# Patient Record
Sex: Male | Born: 1950 | Race: White | Hispanic: No | Marital: Single | Smoking: Former smoker
Health system: Southern US, Community
[De-identification: ages and names within clinical notes are randomized; demographics above are authoritative.]

## PROBLEM LIST (undated history)

## (undated) DIAGNOSIS — M1990 Unspecified osteoarthritis, unspecified site: Secondary | ICD-10-CM

## (undated) DIAGNOSIS — I639 Cerebral infarction, unspecified: Secondary | ICD-10-CM

## (undated) DIAGNOSIS — E119 Type 2 diabetes mellitus without complications: Secondary | ICD-10-CM

## (undated) DIAGNOSIS — K219 Gastro-esophageal reflux disease without esophagitis: Secondary | ICD-10-CM

## (undated) DIAGNOSIS — Z87442 Personal history of urinary calculi: Secondary | ICD-10-CM

## (undated) DIAGNOSIS — I10 Essential (primary) hypertension: Secondary | ICD-10-CM

## (undated) HISTORY — DX: Type 2 diabetes mellitus without complications: E11.9

## (undated) HISTORY — PX: KNEE ARTHROSCOPY: SHX127

## (undated) HISTORY — PX: CHOLECYSTECTOMY: SHX55

---

## 1993-12-28 HISTORY — PX: BACK SURGERY: SHX140

## 2003-12-29 HISTORY — PX: APPENDECTOMY: SHX54

## 2006-09-10 ENCOUNTER — Ambulatory Visit (HOSPITAL_BASED_OUTPATIENT_CLINIC_OR_DEPARTMENT_OTHER): Admission: RE | Admit: 2006-09-10 | Discharge: 2006-09-10 | Payer: Self-pay | Admitting: Orthopedic Surgery

## 2014-12-18 ENCOUNTER — Encounter (HOSPITAL_COMMUNITY)
Admission: RE | Admit: 2014-12-18 | Discharge: 2014-12-18 | Disposition: A | Discharge status: Home / Self Care | Payer: Medicare Other | Source: Ambulatory Visit | Attending: Orthopedic Surgery | Admitting: Orthopedic Surgery

## 2014-12-18 ENCOUNTER — Encounter (HOSPITAL_COMMUNITY): Payer: Self-pay

## 2014-12-18 ENCOUNTER — Other Ambulatory Visit: Payer: Self-pay | Admitting: Orthopedic Surgery

## 2014-12-18 DIAGNOSIS — Z79899 Other long term (current) drug therapy: Secondary | ICD-10-CM | POA: Diagnosis not present

## 2014-12-18 DIAGNOSIS — I10 Essential (primary) hypertension: Secondary | ICD-10-CM | POA: Insufficient documentation

## 2014-12-18 DIAGNOSIS — Z9049 Acquired absence of other specified parts of digestive tract: Secondary | ICD-10-CM | POA: Insufficient documentation

## 2014-12-18 DIAGNOSIS — Z9089 Acquired absence of other organs: Secondary | ICD-10-CM | POA: Insufficient documentation

## 2014-12-18 DIAGNOSIS — Z981 Arthrodesis status: Secondary | ICD-10-CM | POA: Insufficient documentation

## 2014-12-18 DIAGNOSIS — Z01810 Encounter for preprocedural cardiovascular examination: Secondary | ICD-10-CM | POA: Insufficient documentation

## 2014-12-18 DIAGNOSIS — I371 Nonrheumatic pulmonary valve insufficiency: Secondary | ICD-10-CM | POA: Diagnosis not present

## 2014-12-18 DIAGNOSIS — N200 Calculus of kidney: Secondary | ICD-10-CM | POA: Diagnosis not present

## 2014-12-18 DIAGNOSIS — Z01818 Encounter for other preprocedural examination: Secondary | ICD-10-CM | POA: Insufficient documentation

## 2014-12-18 DIAGNOSIS — Z87891 Personal history of nicotine dependence: Secondary | ICD-10-CM | POA: Insufficient documentation

## 2014-12-18 DIAGNOSIS — M1712 Unilateral primary osteoarthritis, left knee: Secondary | ICD-10-CM | POA: Insufficient documentation

## 2014-12-18 DIAGNOSIS — M1990 Unspecified osteoarthritis, unspecified site: Secondary | ICD-10-CM | POA: Diagnosis not present

## 2014-12-18 DIAGNOSIS — E119 Type 2 diabetes mellitus without complications: Secondary | ICD-10-CM | POA: Diagnosis not present

## 2014-12-18 DIAGNOSIS — Z8673 Personal history of transient ischemic attack (TIA), and cerebral infarction without residual deficits: Secondary | ICD-10-CM | POA: Insufficient documentation

## 2014-12-18 DIAGNOSIS — K219 Gastro-esophageal reflux disease without esophagitis: Secondary | ICD-10-CM | POA: Diagnosis not present

## 2014-12-18 DIAGNOSIS — Z7982 Long term (current) use of aspirin: Secondary | ICD-10-CM | POA: Diagnosis not present

## 2014-12-18 HISTORY — DX: Cerebral infarction, unspecified: I63.9

## 2014-12-18 HISTORY — DX: Type 2 diabetes mellitus without complications: E11.9

## 2014-12-18 HISTORY — DX: Essential (primary) hypertension: I10

## 2014-12-18 HISTORY — DX: Personal history of urinary calculi: Z87.442

## 2014-12-18 HISTORY — DX: Gastro-esophageal reflux disease without esophagitis: K21.9

## 2014-12-18 HISTORY — DX: Unspecified osteoarthritis, unspecified site: M19.90

## 2014-12-18 LAB — TYPE AND SCREEN
ABO/RH(D): O NEG
ANTIBODY SCREEN: NEGATIVE

## 2014-12-18 LAB — BASIC METABOLIC PANEL
ANION GAP: 11 (ref 5–15)
BUN: 15 mg/dL (ref 6–23)
CALCIUM: 9.4 mg/dL (ref 8.4–10.5)
CO2: 26 mmol/L (ref 19–32)
CREATININE: 0.95 mg/dL (ref 0.50–1.35)
Chloride: 100 mEq/L (ref 96–112)
GFR, EST NON AFRICAN AMERICAN: 85 mL/min — AB (ref >90)
Glucose, Bld: 241 mg/dL — ABNORMAL HIGH (ref 70–99)
Potassium: 4 mmol/L (ref 3.5–5.1)
Sodium: 137 mmol/L (ref 135–145)

## 2014-12-18 LAB — SURGICAL PCR SCREEN
MRSA, PCR: NEGATIVE
STAPHYLOCOCCUS AUREUS: NEGATIVE

## 2014-12-18 LAB — CBC
HCT: 42.2 % (ref 39.0–52.0)
Hemoglobin: 14.6 g/dL (ref 13.0–17.0)
MCH: 31.8 pg (ref 26.0–34.0)
MCHC: 34.6 g/dL (ref 30.0–36.0)
MCV: 91.9 fL (ref 78.0–100.0)
Platelets: 257 10*3/uL (ref 150–400)
RBC: 4.59 MIL/uL (ref 4.22–5.81)
RDW: 12.8 % (ref 11.5–15.5)
WBC: 8 10*3/uL (ref 4.0–10.5)

## 2014-12-18 LAB — ABO/RH: ABO/RH(D): O NEG

## 2014-12-18 LAB — PROTIME-INR
INR: 0.96 (ref 0.00–1.49)
PROTHROMBIN TIME: 12.9 s (ref 11.6–15.2)

## 2014-12-18 LAB — APTT: aPTT: 30 seconds (ref 24–37)

## 2014-12-18 NOTE — Progress Notes (Signed)
Call to pharm. Tech to include additional medicine.

## 2014-12-18 NOTE — Pre-Procedure Instructions (Signed)
Cameron [name]  12/18/2014   Your procedure is scheduled on:  12/31/2014  [facility] Admitting  ENTRANCE A  at undetermined- AM., therefore on day of surgery at 8AM, please call Short Stay Dept. For arrival time  Call this number if you have problems the morning of surgery: [phone]   Remember:   Do not eat food or drink liquids after midnight. On SUNDAY   Take these medicines the morning of surgery with A SIP OF WATER: You can take pain medicine the morning of surgery if you need it.    Do not wear jewelry   Do not wear lotions, powders, or perfumes. You may wear deodorant.    Men may shave face and neck.   Do not bring valuables to the hospital.  [facility] is not responsible   for any belongings or valuables.               Contacts, dentures or bridgework may not be worn into surgery.   Leave suitcase in the car. After surgery it may be brought to your room.   For patients admitted to the hospital, discharge time is determined by your                treatment team.               Patients discharged the day of surgery will not be allowed to drive  home.  Name and phone number of your driver: with wife   Special Instructions: Special Instructions: [place] - Preparing for Surgery  Before surgery, you can play an important role.  Because skin is not sterile, your skin needs to be as free of germs as possible.  You can reduce the number of germs on you skin by washing with CHG (chlorahexidine gluconate) soap before surgery.  CHG is an antiseptic cleaner which kills germs and bonds with the skin to continue killing germs even after washing.  Please DO NOT use if you have an allergy to CHG or antibacterial soaps.  If your skin becomes reddened/irritated stop using the CHG and inform your nurse when you arrive at Short Stay.  Do not shave (including legs and underarms) for at least 48 hours prior to the first CHG shower.  You may shave your  face.  Please follow these instructions carefully:   1.  Shower with CHG Soap the night before surgery and the  morning of Surgery.  2.  If you choose to wash your hair, wash your hair first as usual with your  normal shampoo.  3.  After you shampoo, rinse your hair and body thoroughly to remove the  Shampoo.  4.  Use CHG as you would any other liquid soap.  You can apply chg directly to the skin and wash gently with scrungie or a clean washcloth.  5.  Apply the CHG Soap to your body ONLY FROM THE NECK DOWN.    Do not use on open wounds or open sores.  Avoid contact with your eyes, ears, mouth and genitals (private parts).  Wash genitals (private parts)   with your normal soap.  6.  Wash thoroughly, paying special attention to the area where your surgery will be performed.  7.  Thoroughly rinse your body with warm water from the neck down.  8.  DO NOT shower/wash with your normal soap after using and rinsing off   the CHG Soap.  9.  Pat yourself dry with  a clean towel.            10.  Wear clean pajamas.            11.  Place clean sheets on your bed the night of your first shower and do not sleep with pets.  Day of Surgery  Do not apply any lotions/deodorants the morning of surgery.  Please wear clean clothes to the hospital/surgery center.   Please read over the following fact sheets that you were given: Pain Booklet, Coughing and Deep Breathing, Blood Transfusion Information, MRSA Information and Surgical Site Infection Prevention, Video information sheet

## 2014-12-18 NOTE — Progress Notes (Signed)
Will fax request to [facility] For records, pt. Reports a small stroke /w echo being done 10/14.

## 2014-12-19 ENCOUNTER — Encounter (HOSPITAL_COMMUNITY): Payer: Self-pay

## 2014-12-19 NOTE — Progress Notes (Signed)
Anesthesia Chart Review: Patient is a 63 year old male scheduled for left TKA on 12/31/14 by Dr. Luiz BlareGraves.  History includes former smoker, HTN, DM2, right thalamic CVA with RLE weakness (denied residual symptoms) 10/2013 [facility]), GERD, nephrolithiasis, arthritis, cholecystectomy, appendectomy, lumbar fusion '95. PCP is Hillard DankerJudy Keighron, [place]NP-[place] in [place].  Meds include amlodipine, finasteride, glimepiride, metformin, omeprazole, Januvia, Mucinex, red yeast rice.  Echo 10/28/13 [facility]): Technically difficult study. Overall norma LV systolic function, no obvious LV regional wall abnormalities other than abnormal septal motion on limited views. Pulmonic regurgitation. Anterior echo-free space with differential including artifact, epicardial fat or pericardial effusion. Thickening of the atrial septum with sparing of the fossa ovale suggestive of benign lipomatous hypertrophy. Bubble contrast study was performed to assess for PFO but was technically diffcult, precluding accurate assessment. Recommendations: Consider TEE if clinically indicated.   According to 10/2013 discharge summary [facility], "Carotid dopplers and MRA was negative."  12/18/14 EKG: NSR.  Preoperative labs noted.  He will get a fasting glucose on arrival.  I called and spoke with patient.  He denies chest pain, SOB at rest, family or personal history of CAD.  He has mild chronic exertional dyspnea which he feels is stable.  He has walked with a cane for the past three years due to his knee arthritis, but is still able to play golf (uses cart) and do yard work like chopping wood without CV symptoms.  His fasting glucose runs ~ 120-140.    Above reviewed with anesthesiologist Dr. Maple HudsonMoser including echocardiogram findings from last year.  Patient reports reasonable exercise tolerance (ie able to chop wood). EKG is WNL. If no acute changes then anticipate that he can proceed as  planned. Definitive anesthesia plan following anesthesiologist evaluation on the day of surgery.  Velna Ochsllison Rokia Bosket, PA-C [facility] Short Stay Center/Anesthesiology Phone [phone] 12/19/2014 4:45 PM

## 2014-12-30 MED ORDER — CEFAZOLIN SODIUM-DEXTROSE 2-3 GM-% IV SOLR
2.0000 g | INTRAVENOUS | Status: AC
  Administered 2014-12-31: 2 g via INTRAVENOUS
  Filled 2014-12-30: qty 50

## 2014-12-31 ENCOUNTER — Encounter (HOSPITAL_COMMUNITY): Payer: Self-pay | Admitting: Certified Registered Nurse Anesthetist

## 2014-12-31 ENCOUNTER — Inpatient Hospital Stay (HOSPITAL_COMMUNITY): Payer: Medicare Other | Admitting: Vascular Surgery

## 2014-12-31 ENCOUNTER — Encounter (HOSPITAL_COMMUNITY)
Admission: RE | Disposition: A | Discharge status: Home / Self Care | Payer: Self-pay | Source: Ambulatory Visit | Attending: Orthopedic Surgery

## 2014-12-31 ENCOUNTER — Inpatient Hospital Stay (HOSPITAL_COMMUNITY): Payer: Medicare Other

## 2014-12-31 ENCOUNTER — Inpatient Hospital Stay (HOSPITAL_COMMUNITY)
Admission: RE | Admit: 2014-12-31 | Discharge: 2015-01-01 | DRG: 470 | Disposition: A | Discharge status: Home Health | Payer: Medicare Other | Source: Ambulatory Visit | Attending: Orthopedic Surgery | Admitting: Orthopedic Surgery

## 2014-12-31 ENCOUNTER — Inpatient Hospital Stay (HOSPITAL_COMMUNITY): Payer: Medicare Other | Admitting: Anesthesiology

## 2014-12-31 DIAGNOSIS — K219 Gastro-esophageal reflux disease without esophagitis: Secondary | ICD-10-CM | POA: Diagnosis present

## 2014-12-31 DIAGNOSIS — Z87891 Personal history of nicotine dependence: Secondary | ICD-10-CM

## 2014-12-31 DIAGNOSIS — Z8673 Personal history of transient ischemic attack (TIA), and cerebral infarction without residual deficits: Secondary | ICD-10-CM | POA: Diagnosis not present

## 2014-12-31 DIAGNOSIS — I10 Essential (primary) hypertension: Secondary | ICD-10-CM | POA: Diagnosis present

## 2014-12-31 DIAGNOSIS — Z01818 Encounter for other preprocedural examination: Secondary | ICD-10-CM

## 2014-12-31 DIAGNOSIS — M1712 Unilateral primary osteoarthritis, left knee: Secondary | ICD-10-CM | POA: Diagnosis present

## 2014-12-31 DIAGNOSIS — E119 Type 2 diabetes mellitus without complications: Secondary | ICD-10-CM | POA: Diagnosis present

## 2014-12-31 HISTORY — PX: TOTAL KNEE ARTHROPLASTY: SHX125

## 2014-12-31 LAB — GLUCOSE, CAPILLARY
GLUCOSE-CAPILLARY: 123 mg/dL — AB (ref 70–99)
GLUCOSE-CAPILLARY: 149 mg/dL — AB (ref 70–99)
Glucose-Capillary: 160 mg/dL — ABNORMAL HIGH (ref 70–99)

## 2014-12-31 LAB — CBC WITH DIFFERENTIAL/PLATELET
BASOS PCT: 1 % (ref 0–1)
Basophils Absolute: 0 10*3/uL (ref 0.0–0.1)
Eosinophils Absolute: 0.6 10*3/uL (ref 0.0–0.7)
Eosinophils Relative: 7 % — ABNORMAL HIGH (ref 0–5)
HCT: 41.2 % (ref 39.0–52.0)
HEMOGLOBIN: 14.2 g/dL (ref 13.0–17.0)
LYMPHS PCT: 31 % (ref 12–46)
Lymphs Abs: 2.6 10*3/uL (ref 0.7–4.0)
MCH: 30.8 pg (ref 26.0–34.0)
MCHC: 34.5 g/dL (ref 30.0–36.0)
MCV: 89.4 fL (ref 78.0–100.0)
MONO ABS: 0.5 10*3/uL (ref 0.1–1.0)
MONOS PCT: 6 % (ref 3–12)
Neutro Abs: 4.6 10*3/uL (ref 1.7–7.7)
Neutrophils Relative %: 55 % (ref 43–77)
Platelets: 252 10*3/uL (ref 150–400)
RBC: 4.61 MIL/uL (ref 4.22–5.81)
RDW: 12.8 % (ref 11.5–15.5)
WBC: 8.2 10*3/uL (ref 4.0–10.5)

## 2014-12-31 LAB — COMPREHENSIVE METABOLIC PANEL
ALBUMIN: 4.5 g/dL (ref 3.5–5.2)
ALK PHOS: 65 U/L (ref 39–117)
ALT: 26 U/L (ref 0–53)
AST: 34 U/L (ref 0–37)
Anion gap: 12 (ref 5–15)
BILIRUBIN TOTAL: 0.9 mg/dL (ref 0.3–1.2)
BUN: 13 mg/dL (ref 6–23)
CHLORIDE: 104 meq/L (ref 96–112)
CO2: 23 mmol/L (ref 19–32)
Calcium: 9 mg/dL (ref 8.4–10.5)
Creatinine, Ser: 0.85 mg/dL (ref 0.50–1.35)
GFR calc Af Amer: >90 mL/min (ref >90)
GFR calc non Af Amer: 89 mL/min — ABNORMAL LOW (ref >90)
Glucose, Bld: 166 mg/dL — ABNORMAL HIGH (ref 70–99)
POTASSIUM: 4 mmol/L (ref 3.5–5.1)
Sodium: 139 mmol/L (ref 135–145)
Total Protein: 7.6 g/dL (ref 6.0–8.3)

## 2014-12-31 LAB — URINALYSIS, ROUTINE W REFLEX MICROSCOPIC
Bilirubin Urine: NEGATIVE
Glucose, UA: NEGATIVE mg/dL
HGB URINE DIPSTICK: NEGATIVE
KETONES UR: 15 mg/dL — AB
Leukocytes, UA: NEGATIVE
Nitrite: NEGATIVE
PROTEIN: NEGATIVE mg/dL
Specific Gravity, Urine: 1.024 (ref 1.005–1.030)
Urobilinogen, UA: 1 mg/dL (ref 0.0–1.0)
pH: 6.5 (ref 5.0–8.0)

## 2014-12-31 SURGERY — ARTHROPLASTY, KNEE, TOTAL
Anesthesia: Monitor Anesthesia Care | Site: Knee | Laterality: Left

## 2014-12-31 MED ORDER — BUPIVACAINE LIPOSOME 1.3 % IJ SUSP
20.0000 mL | Freq: Once | INTRAMUSCULAR | Status: DC
  Filled 2014-12-31 (×2): qty 20

## 2014-12-31 MED ORDER — BUPIVACAINE IN DEXTROSE 0.75-8.25 % IT SOLN
INTRATHECAL | Status: DC | PRN
  Administered 2014-12-31: 2 mL via INTRATHECAL

## 2014-12-31 MED ORDER — ZOLPIDEM TARTRATE 5 MG PO TABS: 5.0000 mg | ORAL_TABLET | Freq: Every evening | ORAL | Status: DC | PRN

## 2014-12-31 MED ORDER — DIPHENHYDRAMINE HCL 12.5 MG/5ML PO ELIX
12.5000 mg | ORAL_SOLUTION | ORAL | Status: DC | PRN
  Administered 2015-01-01: 25 mg via ORAL
  Filled 2014-12-31: qty 10

## 2014-12-31 MED ORDER — ASPIRIN EC 325 MG PO TBEC
325.0000 mg | DELAYED_RELEASE_TABLET | Freq: Two times a day (BID) | ORAL | Status: DC
  Administered 2015-01-01: 325 mg via ORAL
  Filled 2014-12-31 (×3): qty 1

## 2014-12-31 MED ORDER — ROCURONIUM BROMIDE 50 MG/5ML IV SOLN
INTRAVENOUS | Status: AC
  Filled 2014-12-31: qty 1

## 2014-12-31 MED ORDER — KETOROLAC TROMETHAMINE 15 MG/ML IJ SOLN
INTRAMUSCULAR | Status: AC
  Filled 2014-12-31: qty 1

## 2014-12-31 MED ORDER — PROPOFOL INFUSION 10 MG/ML OPTIME
INTRAVENOUS | Status: DC | PRN
  Administered 2014-12-31: 50 ug/kg/min via INTRAVENOUS

## 2014-12-31 MED ORDER — DEXTROSE 5 % IV SOLN
500.0000 mg | Freq: Four times a day (QID) | INTRAVENOUS | Status: DC | PRN
  Filled 2014-12-31: qty 5

## 2014-12-31 MED ORDER — PROPOFOL 10 MG/ML IV BOLUS
INTRAVENOUS | Status: AC
  Filled 2014-12-31: qty 20

## 2014-12-31 MED ORDER — ALUM & MAG HYDROXIDE-SIMETH 200-200-20 MG/5ML PO SUSP: 30.0000 mL | ORAL | Status: DC | PRN

## 2014-12-31 MED ORDER — FINASTERIDE 5 MG PO TABS
5.0000 mg | ORAL_TABLET | Freq: Every day | ORAL | Status: DC
  Administered 2014-12-31 – 2015-01-01 (×2): 5 mg via ORAL
  Filled 2014-12-31 (×2): qty 1

## 2014-12-31 MED ORDER — LACTATED RINGERS IV SOLN: INTRAVENOUS | Status: DC

## 2014-12-31 MED ORDER — OXYCODONE HCL 5 MG PO TABS
ORAL_TABLET | ORAL | Status: AC
  Filled 2014-12-31: qty 1

## 2014-12-31 MED ORDER — OXYCODONE-ACETAMINOPHEN 5-325 MG PO TABS
1.0000 | ORAL_TABLET | ORAL | Status: DC | PRN
  Administered 2014-12-31 – 2015-01-01 (×3): 2 via ORAL
  Filled 2014-12-31 (×3): qty 2

## 2014-12-31 MED ORDER — BISACODYL 5 MG PO TBEC: 5.0000 mg | DELAYED_RELEASE_TABLET | Freq: Every day | ORAL | Status: DC | PRN

## 2014-12-31 MED ORDER — ONDANSETRON HCL 4 MG PO TABS: 4.0000 mg | ORAL_TABLET | Freq: Four times a day (QID) | ORAL | Status: DC | PRN

## 2014-12-31 MED ORDER — LACTATED RINGERS IV SOLN
INTRAVENOUS | Status: DC | PRN
  Administered 2014-12-31 (×2): via INTRAVENOUS

## 2014-12-31 MED ORDER — FENTANYL CITRATE 0.05 MG/ML IJ SOLN
INTRAMUSCULAR | Status: AC
  Filled 2014-12-31: qty 5

## 2014-12-31 MED ORDER — ACETAMINOPHEN 325 MG PO TABS: 650.0000 mg | ORAL_TABLET | Freq: Four times a day (QID) | ORAL | Status: DC | PRN

## 2014-12-31 MED ORDER — OXYCODONE HCL 5 MG/5ML PO SOLN: 5.0000 mg | Freq: Once | ORAL | Status: AC | PRN

## 2014-12-31 MED ORDER — KETOROLAC TROMETHAMINE 15 MG/ML IJ SOLN
7.5000 mg | Freq: Three times a day (TID) | INTRAMUSCULAR | Status: DC
  Administered 2014-12-31 – 2015-01-01 (×2): 7.5 mg via INTRAVENOUS
  Filled 2014-12-31 (×2): qty 1

## 2014-12-31 MED ORDER — MIDAZOLAM HCL 2 MG/2ML IJ SOLN
INTRAMUSCULAR | Status: AC
  Filled 2014-12-31: qty 2

## 2014-12-31 MED ORDER — CHLORHEXIDINE GLUCONATE 4 % EX LIQD
60.0000 mL | Freq: Once | CUTANEOUS | Status: DC
  Filled 2014-12-31: qty 60

## 2014-12-31 MED ORDER — PANTOPRAZOLE SODIUM 40 MG PO TBEC
40.0000 mg | DELAYED_RELEASE_TABLET | Freq: Every day | ORAL | Status: DC
  Administered 2014-12-31 – 2015-01-01 (×2): 40 mg via ORAL
  Filled 2014-12-31 (×2): qty 1

## 2014-12-31 MED ORDER — AMLODIPINE BESYLATE 2.5 MG PO TABS
2.5000 mg | ORAL_TABLET | Freq: Every day | ORAL | Status: DC
  Administered 2014-12-31: 2.5 mg via ORAL
  Filled 2014-12-31 (×2): qty 1

## 2014-12-31 MED ORDER — SODIUM CHLORIDE 0.9 % IR SOLN
Status: DC | PRN
  Administered 2014-12-31: 3000 mL

## 2014-12-31 MED ORDER — METHOCARBAMOL 500 MG PO TABS
500.0000 mg | ORAL_TABLET | Freq: Four times a day (QID) | ORAL | Status: DC | PRN
Start: 2014-12-31 — End: 2015-01-01
  Administered 2014-12-31 – 2015-01-01 (×2): 500 mg via ORAL
  Filled 2014-12-31: qty 1

## 2014-12-31 MED ORDER — ACETAMINOPHEN 650 MG RE SUPP: 650.0000 mg | Freq: Four times a day (QID) | RECTAL | Status: DC | PRN

## 2014-12-31 MED ORDER — INSULIN ASPART 100 UNIT/ML ~~LOC~~ SOLN: 0.0000 [IU] | Freq: Three times a day (TID) | SUBCUTANEOUS | Status: DC

## 2014-12-31 MED ORDER — BUPIVACAINE LIPOSOME 1.3 % IJ SUSP
INTRAMUSCULAR | Status: DC | PRN
  Administered 2014-12-31: 20 mL

## 2014-12-31 MED ORDER — OXYCODONE HCL 5 MG PO TABS
5.0000 mg | ORAL_TABLET | Freq: Once | ORAL | Status: AC | PRN
  Administered 2014-12-31: 5 mg via ORAL

## 2014-12-31 MED ORDER — PROMETHAZINE HCL 25 MG/ML IJ SOLN: 6.2500 mg | INTRAMUSCULAR | Status: DC | PRN

## 2014-12-31 MED ORDER — HYDROMORPHONE HCL 1 MG/ML IJ SOLN
INTRAMUSCULAR | Status: AC
  Filled 2014-12-31: qty 1

## 2014-12-31 MED ORDER — POLYETHYLENE GLYCOL 3350 17 G PO PACK: 17.0000 g | PACK | Freq: Every day | ORAL | Status: DC | PRN

## 2014-12-31 MED ORDER — FLEET ENEMA 7-19 GM/118ML RE ENEM: 1.0000 | ENEMA | Freq: Once | RECTAL | Status: AC | PRN

## 2014-12-31 MED ORDER — HYDROMORPHONE HCL 1 MG/ML IJ SOLN
0.2500 mg | INTRAMUSCULAR | Status: DC | PRN
  Administered 2014-12-31: 0.5 mg via INTRAVENOUS

## 2014-12-31 MED ORDER — METFORMIN HCL 500 MG PO TABS
1000.0000 mg | ORAL_TABLET | Freq: Two times a day (BID) | ORAL | Status: DC
  Administered 2014-12-31 – 2015-01-01 (×2): 1000 mg via ORAL
  Filled 2014-12-31 (×4): qty 2

## 2014-12-31 MED ORDER — FENTANYL CITRATE 0.05 MG/ML IJ SOLN
INTRAMUSCULAR | Status: DC | PRN
  Administered 2014-12-31: 50 ug via INTRAVENOUS
  Administered 2014-12-31 (×2): 25 ug via INTRAVENOUS

## 2014-12-31 MED ORDER — PROMETHAZINE HCL 25 MG/ML IJ SOLN: 6.2500 mg | Freq: Four times a day (QID) | INTRAMUSCULAR | Status: DC | PRN

## 2014-12-31 MED ORDER — SODIUM CHLORIDE 0.9 % IV SOLN
INTRAVENOUS | Status: DC
  Administered 2014-12-31: 22:00:00 via INTRAVENOUS

## 2014-12-31 MED ORDER — HYDROMORPHONE HCL 1 MG/ML IJ SOLN
1.0000 mg | INTRAMUSCULAR | Status: DC | PRN
  Administered 2014-12-31: 1 mg via INTRAVENOUS
  Administered 2015-01-01: 2 mg via INTRAVENOUS
  Filled 2014-12-31: qty 1
  Filled 2014-12-31: qty 2

## 2014-12-31 MED ORDER — OXYCODONE-ACETAMINOPHEN 5-325 MG PO TABS: 1.0000 | ORAL_TABLET | ORAL | Status: DC | PRN

## 2014-12-31 MED ORDER — DOCUSATE SODIUM 100 MG PO CAPS
100.0000 mg | ORAL_CAPSULE | Freq: Two times a day (BID) | ORAL | Status: DC
  Administered 2014-12-31 – 2015-01-01 (×2): 100 mg via ORAL
  Filled 2014-12-31 (×3): qty 1

## 2014-12-31 MED ORDER — GLIMEPIRIDE 4 MG PO TABS
4.0000 mg | ORAL_TABLET | Freq: Two times a day (BID) | ORAL | Status: DC
  Filled 2014-12-31 (×2): qty 1

## 2014-12-31 MED ORDER — TRANEXAMIC ACID 100 MG/ML IV SOLN
1000.0000 mg | INTRAVENOUS | Status: AC
  Administered 2014-12-31: 1000 mg via INTRAVENOUS
  Filled 2014-12-31 (×2): qty 10

## 2014-12-31 MED ORDER — METHOCARBAMOL 750 MG PO TABS: 750.0000 mg | ORAL_TABLET | Freq: Three times a day (TID) | ORAL | Status: DC | PRN

## 2014-12-31 MED ORDER — INFLUENZA VAC SPLIT QUAD 0.5 ML IM SUSY
0.5000 mL | PREFILLED_SYRINGE | INTRAMUSCULAR | Status: AC
  Administered 2015-01-01: 0.5 mL via INTRAMUSCULAR
  Filled 2014-12-31: qty 0.5

## 2014-12-31 MED ORDER — ONDANSETRON HCL 4 MG/2ML IJ SOLN: 4.0000 mg | Freq: Four times a day (QID) | INTRAMUSCULAR | Status: DC | PRN

## 2014-12-31 MED ORDER — LIDOCAINE HCL (CARDIAC) 20 MG/ML IV SOLN
INTRAVENOUS | Status: AC
  Filled 2014-12-31: qty 5

## 2014-12-31 MED ORDER — METHOCARBAMOL 500 MG PO TABS
ORAL_TABLET | ORAL | Status: AC
Start: 2014-12-31 — End: 2015-01-01
  Filled 2014-12-31: qty 1

## 2014-12-31 MED ORDER — BUPIVACAINE HCL (PF) 0.5 % IJ SOLN
INTRAMUSCULAR | Status: DC | PRN
  Administered 2014-12-31: 20 mL

## 2014-12-31 MED ORDER — ASPIRIN EC 325 MG PO TBEC: 325.0000 mg | DELAYED_RELEASE_TABLET | Freq: Two times a day (BID) | ORAL | Status: AC

## 2014-12-31 MED ORDER — LINAGLIPTIN 5 MG PO TABS
5.0000 mg | ORAL_TABLET | Freq: Every day | ORAL | Status: DC
  Administered 2014-12-31 – 2015-01-01 (×2): 5 mg via ORAL
  Filled 2014-12-31 (×2): qty 1

## 2014-12-31 MED ORDER — CEFAZOLIN SODIUM-DEXTROSE 2-3 GM-% IV SOLR
2.0000 g | Freq: Four times a day (QID) | INTRAVENOUS | Status: AC
  Administered 2014-12-31 – 2015-01-01 (×2): 2 g via INTRAVENOUS
  Filled 2014-12-31 (×2): qty 50

## 2014-12-31 MED ORDER — MIDAZOLAM HCL 5 MG/5ML IJ SOLN
INTRAMUSCULAR | Status: DC | PRN
  Administered 2014-12-31: 2 mg via INTRAVENOUS

## 2014-12-31 SURGICAL SUPPLY — 70 items
APL SKNCLS STERI-STRIP NONHPOA (GAUZE/BANDAGES/DRESSINGS) ×1
BANDAGE ESMARK 6X9 LF (GAUZE/BANDAGES/DRESSINGS) ×1 IMPLANT
BENZOIN TINCTURE PRP APPL 2/3 (GAUZE/BANDAGES/DRESSINGS) ×3 IMPLANT
BLADE SAGITTAL 25.0X1.19X90 (BLADE) ×2 IMPLANT
BLADE SAGITTAL 25.0X1.19X90MM (BLADE) ×1
BLADE SAW SAG 90X13X1.27 (BLADE) ×3 IMPLANT
BNDG CMPR 9X6 STRL LF SNTH (GAUZE/BANDAGES/DRESSINGS) ×1
BNDG ESMARK 6X9 LF (GAUZE/BANDAGES/DRESSINGS) ×3
BOWL SMART MIX CTS (DISPOSABLE) ×3 IMPLANT
CAP KNEE TOTAL 3 SIGMA ×2 IMPLANT
CATH ROBINSON RED A/P 14FR (CATHETERS) ×2 IMPLANT
CEMENT HV SMART SET (Cement) ×6 IMPLANT
CLOSURE WOUND 1/2 X4 (GAUZE/BANDAGES/DRESSINGS) ×1
COVER SURGICAL LIGHT HANDLE (MISCELLANEOUS) ×3 IMPLANT
CUFF TOURNIQUET SINGLE 34IN LL (TOURNIQUET CUFF) ×3 IMPLANT
CUFF TOURNIQUET SINGLE 44IN (TOURNIQUET CUFF) IMPLANT
DRAPE EXTREMITY T 121X128X90 (DRAPE) ×3 IMPLANT
DRAPE IMP U-DRAPE 54X76 (DRAPES) ×3 IMPLANT
DRAPE U-SHAPE 47X51 STRL (DRAPES) ×3 IMPLANT
DRSG PAD ABDOMINAL 8X10 ST (GAUZE/BANDAGES/DRESSINGS) ×3 IMPLANT
DURAPREP 26ML APPLICATOR (WOUND CARE) ×3 IMPLANT
ELECT REM PT RETURN 9FT ADLT (ELECTROSURGICAL) ×3
ELECTRODE REM PT RTRN 9FT ADLT (ELECTROSURGICAL) ×1 IMPLANT
EVACUATOR 1/8 PVC DRAIN (DRAIN) ×3 IMPLANT
FACESHIELD WRAPAROUND (MASK) IMPLANT
FACESHIELD WRAPAROUND OR TEAM (MASK) ×1 IMPLANT
GAUZE SPONGE 4X4 12PLY STRL (GAUZE/BANDAGES/DRESSINGS) ×3 IMPLANT
GAUZE XEROFORM 5X9 LF (GAUZE/BANDAGES/DRESSINGS) ×3 IMPLANT
GLOVE BIOGEL PI IND STRL 6.5 (GLOVE) IMPLANT
GLOVE BIOGEL PI IND STRL 8 (GLOVE) ×2 IMPLANT
GLOVE BIOGEL PI INDICATOR 6.5 (GLOVE) ×4
GLOVE BIOGEL PI INDICATOR 8 (GLOVE) ×4
GLOVE BIOGEL PI ORTHO PRO SZ7 (GLOVE) ×2
GLOVE ECLIPSE 7.5 STRL STRAW (GLOVE) ×6 IMPLANT
GLOVE PI ORTHO PRO STRL SZ7 (GLOVE) IMPLANT
GLOVE SURG ORTHO 8.5 STRL (GLOVE) ×2 IMPLANT
GLOVE SURG SS PI 6.5 STRL IVOR (GLOVE) ×2 IMPLANT
GLOVE SURG SS PI 7.0 STRL IVOR (GLOVE) ×4 IMPLANT
GOWN STRL REUS W/ TWL LRG LVL3 (GOWN DISPOSABLE) ×1 IMPLANT
GOWN STRL REUS W/ TWL XL LVL3 (GOWN DISPOSABLE) ×2 IMPLANT
GOWN STRL REUS W/TWL LRG LVL3 (GOWN DISPOSABLE) ×6
GOWN STRL REUS W/TWL XL LVL3 (GOWN DISPOSABLE) ×9
HANDPIECE INTERPULSE COAX TIP (DISPOSABLE) ×3
HOOD PEEL AWAY FACE SHEILD DIS (HOOD) ×7 IMPLANT
IMMOBILIZER KNEE 20 (SOFTGOODS) IMPLANT
IMMOBILIZER KNEE 22 UNIV (SOFTGOODS) ×3 IMPLANT
KIT BASIN OR (CUSTOM PROCEDURE TRAY) ×3 IMPLANT
KIT ROOM TURNOVER OR (KITS) ×3 IMPLANT
MANIFOLD NEPTUNE II (INSTRUMENTS) ×3 IMPLANT
NDL SPNL 22GX3.5 QUINCKE BK (NEEDLE) ×1 IMPLANT
NEEDLE SPNL 22GX3.5 QUINCKE BK (NEEDLE) ×3 IMPLANT
NS IRRIG 1000ML POUR BTL (IV SOLUTION) ×3 IMPLANT
PACK TOTAL JOINT (CUSTOM PROCEDURE TRAY) ×3 IMPLANT
PACK UNIVERSAL I (CUSTOM PROCEDURE TRAY) ×3 IMPLANT
PAD ARMBOARD 7.5X6 YLW CONV (MISCELLANEOUS) ×4 IMPLANT
PAD CAST 4YDX4 CTTN HI CHSV (CAST SUPPLIES) ×1 IMPLANT
PADDING CAST COTTON 4X4 STRL (CAST SUPPLIES) ×3
SET HNDPC FAN SPRY TIP SCT (DISPOSABLE) ×1 IMPLANT
STAPLER VISISTAT 35W (STAPLE) IMPLANT
STRIP CLOSURE SKIN 1/2X4 (GAUZE/BANDAGES/DRESSINGS) ×2 IMPLANT
SUCTION FRAZIER TIP 10 FR DISP (SUCTIONS) ×3 IMPLANT
SUT MNCRL AB 3-0 PS2 18 (SUTURE) ×2 IMPLANT
SUT VIC AB 0 CTB1 27 (SUTURE) ×6 IMPLANT
SUT VIC AB 1 CT1 27 (SUTURE) ×6
SUT VIC AB 1 CT1 27XBRD ANBCTR (SUTURE) ×2 IMPLANT
SUT VIC AB 2-0 CTB1 (SUTURE) ×6 IMPLANT
SYR 50ML LL SCALE MARK (SYRINGE) ×3 IMPLANT
SYR CONTROL 10ML LL (SYRINGE) ×2 IMPLANT
TOWEL OR 17X24 6PK STRL BLUE (TOWEL DISPOSABLE) ×3 IMPLANT
TOWEL OR 17X26 10 PK STRL BLUE (TOWEL DISPOSABLE) ×3 IMPLANT

## 2014-12-31 NOTE — Discharge Instructions (Signed)
Total Knee Replacement, Care After °Refer to this sheet in the next few weeks. These instructions provide you with information on caring for yourself after your procedure. Your health care provider also may give you specific instructions. Your treatment has been planned according to the most current medical practices, but problems sometimes occur. Call your health care provider if you have any problems or questions after your procedure. °HOME CARE INSTRUCTIONS  °· See a physical therapist as directed by your health care provider. °· Take medicines only as directed by your health care provider. °· Avoid lifting or driving until you are instructed otherwise. °· If you have been sent home with a continuous passive motion machine, use it as directed by your health care provider. °SEEK MEDICAL CARE IF: °· You have difficulty breathing. °· You have drainage, redness, swelling, or pain at your incision site. °· You have a bad smell coming from your incision site. °· You have persistent bleeding from your incision site. °· Your incision breaks open after sutures (stitches) or staples have been removed. °· You have a fever. °SEEK IMMEDIATE MEDICAL CARE IF:  °· You have a rash. °· You have pain or swelling in your calf or thigh. °· You have shortness of breath or chest pain. °· Your range of motion in your knee is decreasing rather than increasing. °MAKE SURE YOU:  °· Understand these instructions. °· Will watch your condition. °· Will get help right away if you are not doing well or get worse. °Document Released: 07/03/2005 Document Revised: 04/30/2014 Document Reviewed: 02/02/2012 °ExitCare® Patient Information ©2015 [employer]. This information is not intended to replace advice given to you by your health care provider. Make sure you discuss any questions you have with your health care provider. ° °

## 2014-12-31 NOTE — H&P (Addendum)
TOTAL KNEE ADMISSION H&P  Patient is being admitted for left total knee arthroplasty.  Subjective:  Chief Complaint:left knee pain.  HPI: Cameron [name], 64 y.o. male, has a history of pain and functional disability in the left knee due to arthritis and has failed non-surgical conservative treatments for greater than 12 weeks to includeNSAID's and/or analgesics, corticosteriod injections, viscosupplementation injections, flexibility and strengthening excercises, supervised PT with diminished ADL's post treatment, weight reduction as appropriate and activity modification.  Onset of symptoms was gradual, starting 5 years ago with gradually worsening course since that time. The patient noted prior procedures on the knee to include  arthroscopy and menisectomy on the left knee(s).  Patient currently rates pain in the left knee(s) at 8 out of 10 with activity. Patient has night pain, worsening of pain with activity and weight bearing, pain that interferes with activities of daily living, pain with passive range of motion, crepitus and joint swelling.  Patient has evidence of subchondral sclerosis, periarticular osteophytes, joint subluxation and joint space narrowing by imaging studies. This patient has had failure of all reasonable conservative care. There is no active infection.  There are no active problems to display for this patient.  Past Medical History  Diagnosis Date  . Hypertension   . Diabetes mellitus without complication   . GERD (gastroesophageal reflux disease)   . H/O renal calculi     passed several on his own, also has had procedures for    . Arthritis     in the knees, hands & neck   . Stroke     "small stroke" 10/2013, states he had care at [facility]    Past Surgical History  Procedure Laterality Date  . Back surgery  1995    fusion- [facility]   . Appendectomy  2005  . Cholecystectomy    . Knee arthroscopy Left     No prescriptions prior to  admission   No Known Allergies  History  Substance Use Topics  . Smoking status: Former Smoker    Quit date: 12/18/1973  . Smokeless tobacco: Not on file  . Alcohol Use: No    No family history on file.   ROS ROS: I have reviewed the patient's review of systems thoroughly and there are no positive responses as relates to the HPI.  Objective:  Physical Exam  Vital signs in last 24 hours:    Filed Vitals:   12/31/14 1309  BP: 157/82  Pulse: 66  Temp: 98.3 F (36.8 C)  Resp: 20    Well-developed well-nourished patient in no acute distress. Alert and oriented x3 HEENT:within normal limits Cardiac: Regular rate and rhythm Pulmonary: Lungs clear to auscultation Abdomen: Soft and nontender.  Normal active bowel sounds  Musculoskeletal: (Left knee: Significant varus deformity.  Grinding through range of motion.  Pain to range of motion.  Trace effusion.  No instability.  Range of motion 0-105. Labs:  Recent Results (from the past 2160 hour(s))  Basic metabolic panel     Status: Abnormal   Collection Time: 12/18/14 10:20 AM  Result Value Ref Range   Sodium 137 135 - 145 mmol/L    Comment: Please note change in reference range.   Potassium 4.0 3.5 - 5.1 mmol/L    Comment: Please note change in reference range.   Chloride 100 96 - 112 mEq/L   CO2 26 19 - 32 mmol/L   Glucose, Bld 241 (H) 70 - 99 mg/dL   BUN 15 6 -  23 mg/dL   Creatinine, Ser 0.95 0.50 - 1.35 mg/dL   Calcium 9.4 8.4 - 10.5 mg/dL   GFR calc non Af Amer 85 (L) >90 mL/min   GFR calc Af Amer >90 >90 mL/min    Comment: (NOTE) The eGFR has been calculated using the CKD EPI equation. This calculation has not been validated in all clinical situations. eGFR's persistently <90 mL/min signify possible Chronic Kidney Disease.    Anion gap 11 5 - 15  CBC     Status: None   Collection Time: 12/18/14 10:20 AM  Result Value Ref Range   WBC 8.0 4.0 - 10.5 K/uL   RBC 4.59 4.22 - 5.81 MIL/uL   Hemoglobin 14.6 13.0  - 17.0 g/dL   HCT 42.2 39.0 - 52.0 %   MCV 91.9 78.0 - 100.0 fL   MCH 31.8 26.0 - 34.0 pg   MCHC 34.6 30.0 - 36.0 g/dL   RDW 12.8 11.5 - 15.5 %   Platelets 257 150 - 400 K/uL  Protime-INR     Status: None   Collection Time: 12/18/14 10:20 AM  Result Value Ref Range   Prothrombin Time 12.9 11.6 - 15.2 seconds   INR 0.96 0.00 - 1.49  PTT     Status: None   Collection Time: 12/18/14 10:20 AM  Result Value Ref Range   aPTT 30 24 - 37 seconds  Type and screen     Status: None   Collection Time: 12/18/14 10:20 AM  Result Value Ref Range   ABO/RH(D) O NEG    Antibody Screen NEG    Sample Expiration 01/01/2015   ABO/Rh     Status: None   Collection Time: 12/18/14 10:20 AM  Result Value Ref Range   ABO/RH(D) O NEG   Surgical pcr screen     Status: None   Collection Time: 12/18/14 10:21 AM  Result Value Ref Range   MRSA, PCR NEGATIVE NEGATIVE   Staphylococcus aureus NEGATIVE NEGATIVE    Comment:        The Xpert SA Assay (FDA approved for NASAL specimens in patients over 60 years of age), is one component of a comprehensive surveillance program.  Test performance has been validated by [employer] for patients greater than or equal to 62 year old. It is not intended to diagnose infection nor to guide or monitor treatment.     There is no height or weight on file to calculate BMI.   Imaging Review Plain radiographs demonstrate severe degenerative joint disease of the left knee(s). The overall alignment issignificant varus. The bone quality appears to be good for age and reported activity level.  Assessment/Plan:  End stage arthritis, left knee   The patient history, physical examination, clinical judgment of the provider and imaging studies are consistent with end stage degenerative joint disease of the left knee(s) and total knee arthroplasty is deemed medically necessary. The treatment options including medical management, injection therapy arthroscopy and arthroplasty  were discussed at length. The risks and benefits of total knee arthroplasty were presented and reviewed. The risks due to aseptic loosening, infection, stiffness, patella tracking problems, thromboembolic complications and other imponderables were discussed. The patient acknowledged the explanation, agreed to proceed with the plan and consent was signed. Patient is being admitted for inpatient treatment for surgery, pain control, PT, OT, prophylactic antibiotics, VTE prophylaxis, progressive ambulation and ADL's and discharge planning. The patient is planning to be discharged home with home health services

## 2014-12-31 NOTE — Anesthesia Postprocedure Evaluation (Signed)
  Anesthesia Post-op Note  Patient: Cameron [name]  Procedure(s) Performed: Procedure(s) with comments: TOTAL KNEE ARTHROPLASTY (Left) - Left knee arthroplasty  Patient Location: PACU  Anesthesia Type: Spinal/MAC  Level of Consciousness: awake and alert   Airway and Oxygen Therapy: Patient Spontanous Breathing  Post-op Pain: mild  Post-op Assessment: Post-op Vital signs reviewed, Patient's Cardiovascular Status Stable and Respiratory Function Stable. No residual motor block.  Post-op Vital Signs: Reviewed  Filed Vitals:   12/31/14 1900  BP:   Pulse: 63  Temp:   Resp: 13    Complications: No apparent anesthesia complications

## 2014-12-31 NOTE — Anesthesia Procedure Notes (Signed)
Spinal Patient location during procedure: OR Staffing Anesthesiologist: Suzette Battiest E Performed by: anesthesiologist  Preanesthetic Checklist Completed: patient identified, site marked, surgical consent, pre-op evaluation, timeout performed, IV checked, risks and benefits discussed and monitors and equipment checked Spinal Block Patient position: sitting Prep: DuraPrep Patient monitoring: heart rate, continuous pulse ox and blood pressure Approach: midline Location: L5-S1 Injection technique: single-shot Needle Needle type: Spinocan  Needle gauge: 22 G Needle length: 9 cm Additional Notes Expiration date of kit checked and confirmed. Patient tolerated procedure well, without complications.

## 2014-12-31 NOTE — Brief Op Note (Signed)
12/31/2014  5:28 PM  PATIENT:  Cameron [name]  64 y.o. male  PRE-OPERATIVE DIAGNOSIS:  Left knee osteoarthristis  POST-OPERATIVE DIAGNOSIS:  Left knee osteoarthristis  PROCEDURE:  Procedure(s) with comments: TOTAL KNEE ARTHROPLASTY (Left) - Left knee arthroplasty  SURGEON:  Surgeon(s) and Role:    * Harvie Junior, MD - Primary  PHYSICIAN ASSISTANT:   ASSISTANTS: bethune   ANESTHESIA:   general  EBL:  Total I/O In: 1000 [I.V.:1000] Out: 50 [Blood:50]  BLOOD ADMINISTERED:none  DRAINS: (1) Hemovact drain(s) in the l knee with  Suction Open   LOCAL MEDICATIONS USED:  MARCAINE     SPECIMEN:  No Specimen  DISPOSITION OF SPECIMEN:  N/A  COUNTS:  YES  TOURNIQUET:   Total Tourniquet Time Documented: Thigh (Left) - 6 minutes Thigh (Left) - 56 minutes Total: Thigh (Left) - 62 minutes   DICTATION: .Other Dictation: Dictation Number [id number]  PLAN OF CARE: Admit to inpatient   PATIENT DISPOSITION:  PACU - hemodynamically stable.   Delay start of Pharmacological VTE agent (>24hrs) due to surgical blood loss or risk of bleeding: no

## 2014-12-31 NOTE — OR Nursing (Signed)
In and out cathed before patient left OR at 1800.

## 2014-12-31 NOTE — Anesthesia Preprocedure Evaluation (Addendum)
Anesthesia Evaluation  Patient identified by MRN, date of birth, ID band Patient awake    Reviewed: Allergy & Precautions, NPO status , Patient's Chart, lab work & pertinent test results  Airway Mallampati: III  TM Distance: >3 FB Neck ROM: Full    Dental  (+) Upper Dentures, Partial Lower   Pulmonary former smoker,          Cardiovascular hypertension, Pt. on medications     Neuro/Psych CVA negative psych ROS   GI/Hepatic Neg liver ROS, GERD-  ,  Endo/Other  diabetes, Type 2, Oral Hypoglycemic Agents  Renal/GU negative Renal ROS     Musculoskeletal  (+) Arthritis -,   Abdominal   Peds  Hematology negative hematology ROS (+)   Anesthesia Other Findings   Reproductive/Obstetrics                            Anesthesia Physical Anesthesia Plan  ASA: II  Anesthesia Plan: MAC and Spinal   Post-op Pain Management:    Induction: Intravenous  Airway Management Planned: Natural Airway and Simple Face Mask  Additional Equipment:   Intra-op Plan:   Post-operative Plan:   Informed Consent: I have reviewed the patients History and Physical, chart, labs and discussed the procedure including the risks, benefits and alternatives for the proposed anesthesia with the patient or authorized representative who has indicated his/her understanding and acceptance.     Plan Discussed with: CRNA  Anesthesia Plan Comments:        Anesthesia Quick Evaluation

## 2014-12-31 NOTE — Transfer of Care (Signed)
Immediate Anesthesia Transfer of Care Note  Patient: Cameron [name]  Procedure(s) Performed: Procedure(s) with comments: TOTAL KNEE ARTHROPLASTY (Left) - Left knee arthroplasty  Patient Location: PACU  Anesthesia Type:MAC and Spinal  Level of Consciousness: awake, alert  and oriented  Airway & Oxygen Therapy: Patient Spontanous Breathing and Patient connected to face mask oxygen  Post-op Assessment: Report given to PACU RN and Post -op Vital signs reviewed and stable  Post vital signs: Reviewed and stable  Complications: No apparent anesthesia complications

## 2014-12-31 NOTE — Progress Notes (Signed)
Orthopedic Tech Progress Note Patient Details:  Cameron [name] 1951-06-30 [id number]  CPM Left Knee CPM Left Knee: On Left Knee Flexion (Degrees): 90 Left Knee Extension (Degrees): 0   Jennye Moccasin 12/31/2014, 6:55 PM

## 2015-01-01 ENCOUNTER — Encounter (HOSPITAL_COMMUNITY): Payer: Self-pay | Admitting: Orthopedic Surgery

## 2015-01-01 DIAGNOSIS — M1712 Unilateral primary osteoarthritis, left knee: Secondary | ICD-10-CM | POA: Diagnosis not present

## 2015-01-01 LAB — BASIC METABOLIC PANEL
Anion gap: 9 (ref 5–15)
BUN: 12 mg/dL (ref 6–23)
CALCIUM: 8.2 mg/dL — AB (ref 8.4–10.5)
CO2: 24 mmol/L (ref 19–32)
CREATININE: 1.08 mg/dL (ref 0.50–1.35)
Chloride: 106 mEq/L (ref 96–112)
GFR, EST AFRICAN AMERICAN: 81 mL/min — AB (ref >90)
GFR, EST NON AFRICAN AMERICAN: 70 mL/min — AB (ref >90)
Glucose, Bld: 179 mg/dL — ABNORMAL HIGH (ref 70–99)
Potassium: 3.9 mmol/L (ref 3.5–5.1)
Sodium: 139 mmol/L (ref 135–145)

## 2015-01-01 LAB — CBC
HCT: 34.7 % — ABNORMAL LOW (ref 39.0–52.0)
HEMOGLOBIN: 11.9 g/dL — AB (ref 13.0–17.0)
MCH: 31.7 pg (ref 26.0–34.0)
MCHC: 34.3 g/dL (ref 30.0–36.0)
MCV: 92.5 fL (ref 78.0–100.0)
Platelets: 216 10*3/uL (ref 150–400)
RBC: 3.75 MIL/uL — ABNORMAL LOW (ref 4.22–5.81)
RDW: 13.1 % (ref 11.5–15.5)
WBC: 10.2 10*3/uL (ref 4.0–10.5)

## 2015-01-01 NOTE — Plan of Care (Signed)
Problem: Consults Goal: Diagnosis- Total Joint Replacement Primary Total Knee     

## 2015-01-01 NOTE — Progress Notes (Signed)
Utilization review completed.  

## 2015-01-01 NOTE — Progress Notes (Signed)
CARE MANAGEMENT NOTE 01/01/2015  Patient:  Cameron HidalgoCRANFILL,[name]   Account Number:  [id number]  Date Initiated:  01/01/2015  Documentation initiated by:  [facility],Staphany Ditton  Subjective/Objective Assessment:   TOTAL KNEE ARTHROPLASTY on 12/31/2014     Action/Plan:   Anticipated DC Date:  01/01/2015   Anticipated DC Plan:  HOME W HOME HEALTH SERVICES      DC Planning Services  CM consult      [facility]   Choice offered to / List presented to:  [name]   DME arranged  CPM  [facility]      DME agency  [employer]     HH arranged  HH-2 PT      HH agency  [facility]   Status of service:  Completed, signed off Medicare Important Message given?  NA - LOS <3 / Initial given by admissions (If response is "NO", the following Medicare IM given date fields will be blank) Date Medicare IM given:   Medicare IM given by:   Date Additional Medicare IM given:   Additional Medicare IM given by:    Discharge Disposition:  HOME W HOME HEALTH SERVICES  Per UR Regulation:  Reviewed for med. necessity/level of care/duration of stay  If discussed at Long Length of Stay Meetings, dates discussed:    Comments:  01/01/2015 1045 NCM spoke to pt and RW was delivered to room. Pt has CPM at home. [facility]. [facility] Isidoro DonningAlesia Little Winton RN CCM Case Mgmt phone [phone]

## 2015-01-01 NOTE — Progress Notes (Signed)
Chrissie NoaWilliam P Delfino to be D/C'd Home per MD order. Discussed with the patient and all questions fully answered.    Medication List    STOP taking these medications        naproxen sodium 220 MG tablet  Commonly known as:  ANAPROX      TAKE these medications        amLODipine 2.5 MG tablet  Commonly known as:  NORVASC  Take 2.5 mg by mouth at bedtime.     aspirin EC 325 MG tablet  Take 1 tablet (325 mg total) by mouth 2 (two) times daily after a meal.     finasteride 5 MG tablet  Commonly known as:  PROSCAR  Take 1 tablet by mouth daily.     glimepiride 4 MG tablet  Commonly known as:  AMARYL  Take 4 mg by mouth 2 (two) times daily.     guaiFENesin 600 MG 12 hr tablet  Commonly known as:  MUCINEX  Take 600 mg by mouth 2 (two) times daily.     metFORMIN 1000 MG tablet  Commonly known as:  GLUCOPHAGE  Take 2,000 mg by mouth at bedtime.     methocarbamol 750 MG tablet  Commonly known as:  ROBAXIN-750  Take 1 tablet (750 mg total) by mouth every 8 (eight) hours as needed for muscle spasms.     omeprazole 20 MG capsule  Commonly known as:  PRILOSEC  Take 20 mg by mouth at bedtime.     oxyCODONE-acetaminophen 5-325 MG per tablet  Commonly known as:  PERCOCET/ROXICET  Take 1-2 tablets by mouth every 4 (four) hours as needed for severe pain.     RED YEAST RICE PO  Take 2 tablets by mouth every morning.     sitaGLIPtin 100 MG tablet  Commonly known as:  JANUVIA  Take 100 mg by mouth daily before breakfast.        VVS, Skin clean, dry and intact without evidence of skin break down, no evidence of skin tears noted.  IV catheter discontinued intact. Site without signs and symptoms of complications. Dressing and pressure applied.  An After Visit Summary was printed and given to the patient.  Patient escorted via WC, and D/C home via private auto.  Kai LevinsJacobs, Kenneth Lax N  01/01/2015 11:41 AM

## 2015-01-01 NOTE — Discharge Summary (Signed)
Patient ID: Cameron [name] MRN: [id number] DOB/AGE: 66-Jun-1952 72 y.o.  Admit date: 12/31/2014 Discharge date: 01/01/2015  Admission Diagnoses:  Principal Problem:   Primary osteoarthritis of left knee   Discharge Diagnoses:  Same  Past Medical History  Diagnosis Date  . Hypertension   . Diabetes mellitus without complication   . GERD (gastroesophageal reflux disease)   . H/O renal calculi     passed several on his own, also has had procedures for    . Arthritis     in the knees, hands & neck   . Stroke     "small stroke" 10/2013, states he had care at [facility]    Surgeries: Procedure(s): Left TOTAL KNEE ARTHROPLASTY on 12/31/2014   Consultants:    Discharged Condition: Improved  Hospital Course: Cameron [name] is an 64 y.o. male who was admitted 12/31/2014 for operative treatment ofPrimary osteoarthritis of left knee. Patient has severe unremitting pain that affects sleep, daily activities, and work/hobbies. After pre-op clearance the patient was taken to the operating room on 12/31/2014 and underwent  Procedure(s): Left TOTAL KNEE ARTHROPLASTY.    Patient was given perioperative antibiotics: Anti-infectives    Start     Dose/Rate Route Frequency Ordered Stop   12/31/14 2000  ceFAZolin (ANCEF) IVPB 2 g/50 mL premix     2 g100 mL/hr over 30 Minutes Intravenous Every 6 hours 12/31/14 1954 01/01/15 0135   12/31/14 0600  ceFAZolin (ANCEF) IVPB 2 g/50 mL premix     2 g100 mL/hr over 30 Minutes Intravenous On call to O.R. 12/30/14 1234 12/31/14 1613       Patient was given sequential compression devices, early ambulation, and chemoprophylaxis to prevent DVT.  Patient benefited maximally from hospital stay and there were no complications.    Recent vital signs: Patient Vitals for the past 24 hrs:  BP Temp Temp src Pulse Resp SpO2 Height Weight  01/01/15 0600 127/78 mmHg 97.9 F (36.6 C) - 71 18 97 % - -  01/01/15 0153 140/79 mmHg 98.3 F (36.8 C) - 86 18 95 % -  -  12/31/14 1945 (!) 147/84 mmHg 98.5 F (36.9 C) - 60 16 - - -  12/31/14 1917 - - - 66 19 100 % - -  12/31/14 1915 - - - 62 (!) 22 100 % - -  12/31/14 1900 - 98.3 F (36.8 C) - 63 14 100 % - -  12/31/14 1850 - - - 64 (!) 8 93 % - -  12/31/14 1845 - - - 73 (!) 24 100 % - -  12/31/14 1843 - - - (!) 57 (!) 7 100 % - -  12/31/14 1830 - - - (!) 58 10 100 % - -  12/31/14 1815 - - - 71 20 95 % - -  12/31/14 1804 (!) 159/86 mmHg 98.2 F (36.8 C) - 60 - 100 % - -  12/31/14 1309 (!) 157/82 mmHg 98.3 F (36.8 C) Oral 66 20 99 % 6' (1.829 m) 90.447 kg (199 lb 6.4 oz)     Recent laboratory studies:  Recent Labs  12/31/14 1308 01/01/15 0557  WBC 8.2 10.2  HGB 14.2 11.9*  HCT 41.2 34.7*  PLT 252 216  NA 139 139  K 4.0 3.9  CL 104 106  CO2 23 24  BUN 13 12  CREATININE 0.85 1.08  GLUCOSE 166* 179*  CALCIUM 9.0 8.2*     Discharge Medications:     Medication List  STOP taking these medications        naproxen sodium 220 MG tablet  Commonly known as:  ANAPROX      TAKE these medications        amLODipine 2.5 MG tablet  Commonly known as:  NORVASC  Take 2.5 mg by mouth at bedtime.     aspirin EC 325 MG tablet  Take 1 tablet (325 mg total) by mouth 2 (two) times daily after a meal.     finasteride 5 MG tablet  Commonly known as:  PROSCAR  Take 1 tablet by mouth daily.     glimepiride 4 MG tablet  Commonly known as:  AMARYL  Take 4 mg by mouth 2 (two) times daily.     guaiFENesin 600 MG 12 hr tablet  Commonly known as:  MUCINEX  Take 600 mg by mouth 2 (two) times daily.     metFORMIN 1000 MG tablet  Commonly known as:  GLUCOPHAGE  Take 2,000 mg by mouth at bedtime.     methocarbamol 750 MG tablet  Commonly known as:  ROBAXIN-750  Take 1 tablet (750 mg total) by mouth every 8 (eight) hours as needed for muscle spasms.     omeprazole 20 MG capsule  Commonly known as:  PRILOSEC  Take 20 mg by mouth at bedtime.     oxyCODONE-acetaminophen 5-325 MG per tablet   Commonly known as:  PERCOCET/ROXICET  Take 1-2 tablets by mouth every 4 (four) hours as needed for severe pain.     RED YEAST RICE PO  Take 2 tablets by mouth every morning.     sitaGLIPtin 100 MG tablet  Commonly known as:  JANUVIA  Take 100 mg by mouth daily before breakfast.        Diagnostic Studies: Dg Chest 2 View  12/31/2014   CLINICAL DATA:  Pre operative respiratory exam. For left total knee arthroplasty.  EXAM: CHEST  2 VIEW  COMPARISON:  None.  FINDINGS: There is an 8 x 5 mm nodular density in the left upper midlung zone with a confluence of the anterior aspect of the left third rib in the posterior aspect of the left fifth rib. This could represent a pulmonary nodule or bone island. There are no prior studies of that area of available for comparison.  Heart size and pulmonary vascularity are normal. No infiltrates or effusions. No osseous abnormalities.  IMPRESSION: Small nodular density in the left upper lung zone. CT scan of the chest without contrast recommended for further evaluation of this possible lesion.   Electronically Signed   By: Geanie CooleyJim  Maxwell M.D.   On: 12/31/2014 13:20    Disposition: Home with home health physical therapy      Discharge Instructions    CPM    Complete by:  As directed   Continuous passive motion machine (CPM):      Use the CPM from 0 to 60 for 8 hours per day.      You may increase by 5-10 per day.  You may break it up into 2 or 3 sessions per day.      Use CPM for 1-2 weeks or until you are told to stop.     Call MD / Call 911    Complete by:  As directed   If you experience chest pain or shortness of breath, CALL 911 and be transported to the hospital emergency room.  If you develope a fever above 101 F, pus (white drainage) or increased drainage  or redness at the wound, or calf pain, call your surgeon's office.     Constipation Prevention    Complete by:  As directed   Drink plenty of fluids.  Prune juice may be helpful.  You may use a  stool softener, such as Colace (over the counter) 100 mg twice a day.  Use MiraLax (over the counter) for constipation as needed.     Diet general    Complete by:  As directed      Do not put a pillow under the knee. Place it under the heel.    Complete by:  As directed      Increase activity slowly as tolerated    Complete by:  As directed      Weight bearing as tolerated    Complete by:  As directed   Laterality:  left  Extremity:  Lower     Weight bearing as tolerated    Complete by:  As directed   Laterality:  left  Extremity:  Lower           Follow-up Information    Follow up with GRAVES,JOHN L, MD. Schedule an appointment as soon as possible for a visit in 2 weeks.   Specialty:  Orthopedic Surgery   Contact information:   [address] [place] [address] [phone]        Signed: Matthew Folks 01/01/2015, 9:23 AM

## 2015-01-01 NOTE — Op Note (Signed)
NAME:  Cameron [name], Cameron [name]            ACCOUNT NO.:  [id number]  MEDICAL RECORD NO.:  [id number]  LOCATION:  [id number]                        FACILITY:  [facility]  PHYSICIAN:  Harvie JuniorJohn L. Lean Jaeger, M.D.   DATE OF BIRTH:  03-17-1951  DATE OF PROCEDURE:  12/31/2014 DATE OF DISCHARGE:                              OPERATIVE REPORT   PREOPERATIVE DIAGNOSIS:  End-stage degenerative joint disease, of left knee.  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  PROCEDURE:  Left total knee replacement with Sigma system, size 5 femur, size 5 tibia, 10 mm bridging bearing, and a 41 mm all-polyethylene patella.  SURGEON:  Harvie JuniorJohn L. Myalynn Lingle, M.D.  ASSISTANT:  Marshia LyJames Bethune, P.A.  ANESTHESIA:  General.  BRIEF HISTORY:  [name] is a 64 year old male with a long history of significant complaints of left knee pain.  X-ray showed severe bone- on-bone changes with severe varus malalignment after failure of all conservative care including activity modification, physical therapy, injection therapy, and anti-inflammatory medication and because of night pain and x-ray showing bone-on-bone changes, he was taken to operating room for left total knee replacement.  DESCRIPTION OF PROCEDURE:  The patient was taken to the operating room. After adequate anesthesia was obtained by general anesthetic, the patient was placed supine on the operating table.  Left leg was prepped and draped in usual sterile fashion.  Following this, the leg was exsanguinated.  Blood pressure tourniquet was inflated to 350 mmHg. Following this, a midline incision was made to the subcutaneous tissue down the level of extensor mechanism.  A significant bleeding was encountered here and it was unclear why.  I rechecked the tourniquet pressure and they all seemed okay.  At this point, we made a medial arthrotomy, still continued to bleed.  At that point, I felt like we just need to let the tourniquet down.  I  re-esmarched the knee and increased tourniquet pressure up to 375 and with this bleeding seemed to be well controlled.  At this point, arthrotomy was completed and attention was turned towards a large medial release.  Given the severe varus malalignment, we went down and released the deep MCL and went around posteriorly on the tibia.  At this point, the tibia was exposed after medial and lateral meniscus were removed, retropatellar fat pad, synovium in the anterior aspect of the femur, and anterior and posterior cruciates.  The tibia was then cut perpendicular to its long axis and the intramedullary pilot hole was drilled for the femur and a 5-degree valgus inclination was taken and the distal femur was cut.  Spacer block was put in place at this point and an excellent full extension was achieved.  At this point, the anterior and posterior cuts were made, chamfers and box, and then the tibia was drilled and keeled and size 5 components were placed in the tibia and the femur. At this point, attention was turned to the patella, which was cut down to a level of 13 mm.  Forty-one paddle was chosen and lugs were drilled.  The trial components were then removed and the knee was copiously and thoroughly lavaged and suctioned dry.  The final components were then cemented in place, size  5 tibia, size 5 femur, 10 mm bridging bearing was placed trial followed by a 41 all-poly patella placed and held with a clamp. Once this was completed, attention was turned towards removal of all excess bone cement and then tourniquet was let down.  All bleeding was controlled with electrocautery.  All excess cement was removed.  The 12.5 trial was placed just to test and this was too tight.  At this point, we backed to the 10 and they get excellent to full extension and was excellent in flexion as well.  The medial and lateral stability were excellent at this point as well.  At this point, the 10 was chosen  and placed.  A medium Hemovac drain was placed.  The medial parapatellar arthrotomy was closed with 1 Vicryl running, skin with 0 and 2-0 Vicryl, and 3-0 Monocryl subcuticular.  Benzoin and Steri-Strips were applied. Dry sterile compressive dressing was applied.  The patient was taken to the recovery room, where he was noted to be in satisfactory condition. Estimated blood for the procedure was minimal.     Harvie Junior, M.D.     Ranae Plumber  D:  12/31/2014  T:  01/01/2015  Job:  [id number]

## 2015-01-01 NOTE — Evaluation (Signed)
Physical Therapy Evaluation Patient Details Name: Cameron HidalgoWilliam P Ohlin MRN: [id number] DOB: October 16, 1951 Today's Date: 01/01/2015   History of Present Illness  s/p LTKA  Past Medical History  Diagnosis Date  . Hypertension   . Diabetes mellitus without complication   . GERD (gastroesophageal reflux disease)   . H/O renal calculi     passed several on his own, also has had procedures for    . Arthritis     in the knees, hands & neck   . Stroke     "small stroke" 10/2013, states [facility]   Past Surgical History  Procedure Laterality Date  . Back surgery  1995    fusion- [facility]   . Appendectomy  2005  . Cholecystectomy    . Knee arthroscopy Left      Clinical Impression  Pt is s/p TKA resulting in the deficits listed below (see PT Problem List).  Pt will benefit from skilled PT to increase their independence and safety with mobility to allow discharge to the venue listed below.   Overall moving very well and dc today is totally reasonable; will plan to see again in early afternoon.    Follow Up Recommendations Home health PT    Equipment Recommendations  Rolling walker with 5" wheels    Recommendations for Other Services       Precautions / Restrictions Precautions Precautions: Knee Precaution Comments: .Pt educated to not allow any pillow or bolster under knee for healing with optimal range of motion.  Required Braces or Orthoses: Knee Immobilizer - Left (No specific order, but in room) Restrictions Weight Bearing Restrictions: Yes LLE Weight Bearing: Weight bearing as tolerated      Mobility  Bed Mobility Overal bed mobility: Modified Independent             General bed mobility comments: Used rails  Transfers Overall transfer level: Needs assistance Equipment used: Rolling walker (2 wheeled) Transfers: Sit to/from Stand Sit to Stand: Supervision         General transfer comment: Cues for hand placement and  technqiue  Ambulation/Gait Ambulation/Gait assistance: Supervision Ambulation Distance (Feet): 260 Feet Assistive device: Rolling walker (2 wheeled) Gait Pattern/deviations: Step-through pattern     General Gait Details: Cues to activate L quad for stance stability; Nice stable knee in Engineer, miningstance  Stairs            Wheelchair Mobility    Modified Rankin (Stroke Patients Only)       Balance                                             Pertinent Vitals/Pain Pain Assessment: 0-10 Pain Score: 5  Pain Location: L knee post amb Pain Descriptors / Indicators: Aching Pain Intervention(s): Monitored during session;Repositioned    Home Living Family/patient expects to be discharged to:: Private residence Living Arrangements: Spouse/significant other Available Help at Discharge: Family;Available 24 hours/day Type of Home: House Home Access: Stairs to enter   Entergy CorporationEntrance Stairs-Number of Steps: 0 Home Layout: One level Home Equipment: Walker - 4 wheels      Prior Function Level of Independence: Independent               Hand Dominance        Extremity/Trunk Assessment   Upper Extremity Assessment: Overall WFL for tasks assessed  Lower Extremity Assessment: LLE deficits/detail   LLE Deficits / Details: Very nice quad acitvation, with Straight Leg Raises with minimal quad lag  Cervical / Trunk Assessment: Normal  Communication   Communication: No difficulties  Cognition Arousal/Alertness: Awake/alert Behavior During Therapy: WFL for tasks assessed/performed Overall Cognitive Status: Within Functional Limits for tasks assessed                      General Comments      Exercises Total Joint Exercises Quad Sets: AROM;Left;10 reps Heel Slides: AAROM;Left;10 reps Straight Leg Raises: AROM;Left;10 reps Goniometric ROM: approx 0-85 deg      Assessment/Plan    PT Assessment Patient needs continued PT services  PT  Diagnosis Difficulty walking;Acute pain   PT Problem List Decreased strength;Decreased range of motion;Decreased activity tolerance;Decreased knowledge of use of DME;Decreased knowledge of precautions;Pain  PT Treatment Interventions DME instruction;Gait training;Functional mobility training;Therapeutic activities;Therapeutic exercise;Patient/family education   PT Goals (Current goals can be found in the Care Plan section) Acute Rehab PT Goals Patient Stated Goal: back to golf PT Goal Formulation: With patient Time For Goal Achievement: 01/08/15 Potential to Achieve Goals: Good    Frequency 7X/week   Barriers to discharge        Co-evaluation               End of Session Equipment Utilized During Treatment: Gait belt Activity Tolerance: Patient tolerated treatment well Patient left: in chair;with call bell/phone within reach Nurse Communication: Mobility status         Time: 0820-0852 PT Time Calculation (min) (ACUTE ONLY): 32 min   Charges:   PT Evaluation $Initial PT Evaluation Tier I: 1 Procedure PT Treatments $Gait Training: 8-22 mins $Therapeutic Exercise: 8-22 mins   PT G Codes:        Olen Pel 01/01/2015, 9:08 AM  [facility] [place], [place]  Acute Rehabilitation Services Pager [phone] Office [phone]

## 2015-01-01 NOTE — Progress Notes (Addendum)
Subjective: 1 Day Post-Op Procedure(s) (LRB): TOTAL KNEE ARTHROPLASTY (Left) Patient reports pain as mild.  Taking by mouth and voiding okay. Did great with physical therapy this a.m.  Objective: Vital signs in last 24 hours: Temp:  [97.9 F (36.6 C)-98.5 F (36.9 C)] 97.9 F (36.6 C) (01/05 0600) Pulse Rate:  [57-86] 71 (01/05 0600) Resp:  [7-24] 18 (01/05 0600) BP: (127-159)/(78-86) 127/78 mmHg (01/05 0600) SpO2:  [93 %-100 %] 97 % (01/05 0600) Weight:  [90.447 kg (199 lb 6.4 oz)] 90.447 kg (199 lb 6.4 oz) (01/04 1309)  Intake/Output from previous day: 01/04 0701 - 01/05 0700 In: 1953 [P.O.:340; I.V.:1613] Out: 925 [Urine:600; Drains:275; Blood:50] Intake/Output this shift:     Recent Labs  12/31/14 1308 01/01/15 0557  HGB 14.2 11.9*    Recent Labs  12/31/14 1308 01/01/15 0557  WBC 8.2 10.2  RBC 4.61 3.75*  HCT 41.2 34.7*  PLT 252 216    Recent Labs  12/31/14 1308 01/01/15 0557  NA 139 139  K 4.0 3.9  CL 104 106  CO2 23 24  BUN 13 12  CREATININE 0.85 1.08  GLUCOSE 166* 179*  CALCIUM 9.0 8.2*   No results for input(s): LABPT, INR in the last 72 hours. Left knee exam: Neurovascular intact Sensation intact distally Intact pulses distally Dorsiflexion/Plantar flexion intact Incision: dressing C/D/I Compartment soft  Assessment/Plan: 1 Day Post-Op Procedure(s) (LRB): TOTAL KNEE ARTHROPLASTY (Left) Plan: Discharge home with home health after physical therapy early this afternoon. Drain pulled. Will need home health physical therapy and home CPM machine. Follow-up with Dr. Luiz BlareGraves in 2 weeks. Prescriptions written.  Shirell Struthers G 01/01/2015, 9:12 AM

## 2015-01-25 IMAGING — CR DG CHEST 2V
2 series · 2 of 2 positions shown · non-contrast
Comparison: None.

CLINICAL DATA: Pre operative respiratory exam. For left total knee
arthroplasty.

EXAM:
CHEST  2 VIEW

[w chest pa]
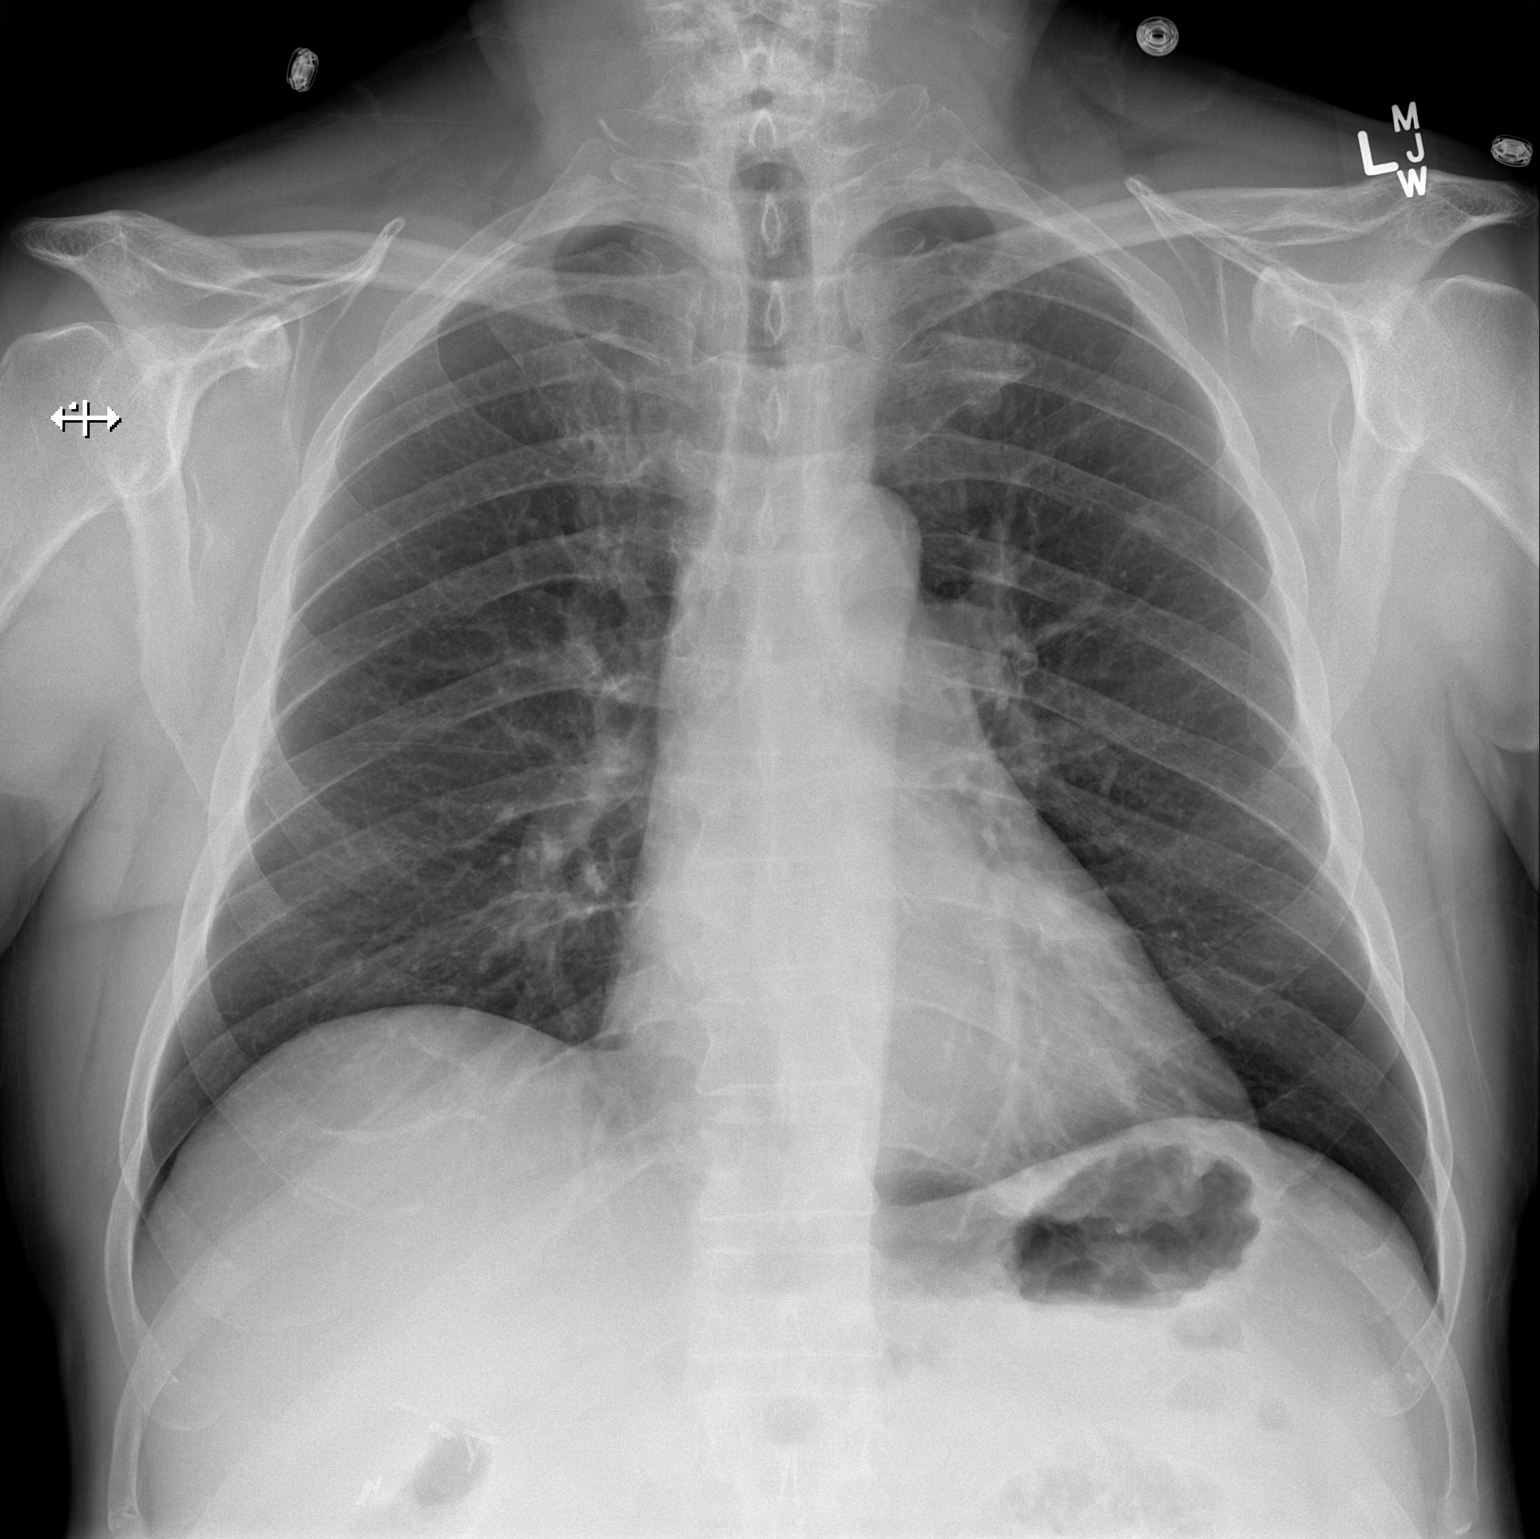

[w chest lat]
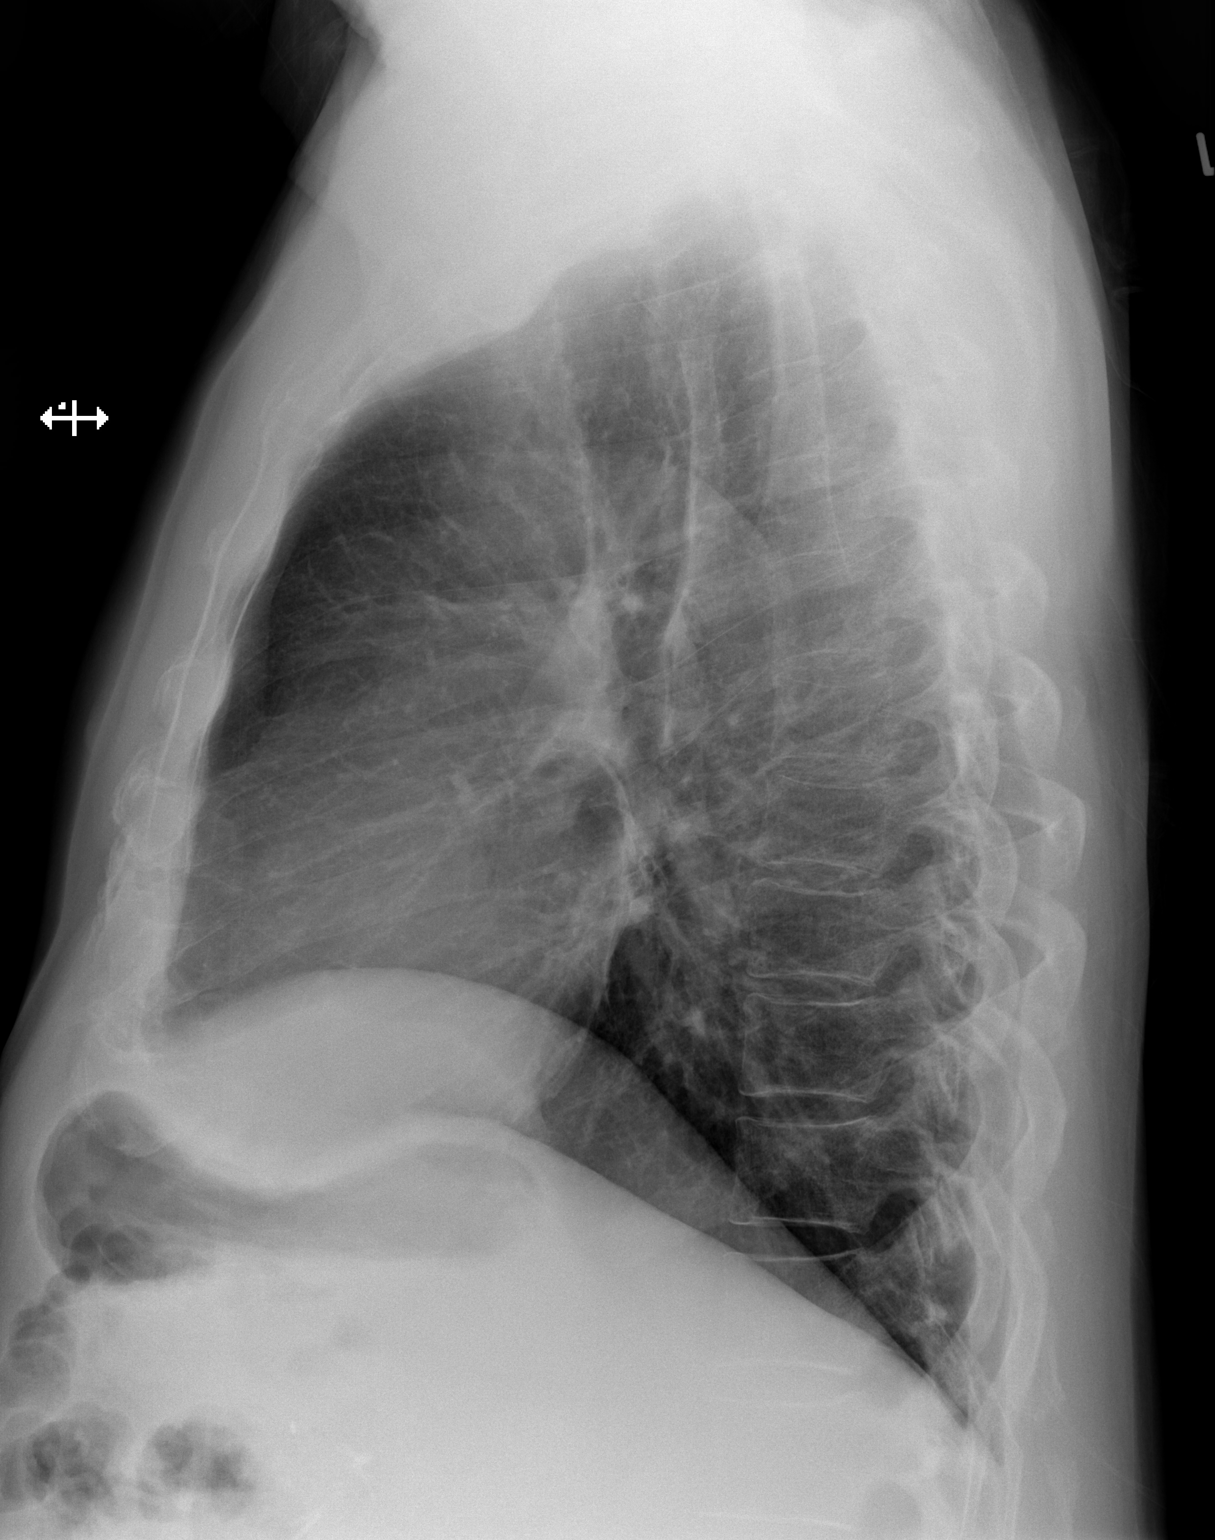

[2 of 2 positions shown; findings below may reference images not displayed]

FINDINGS: There is an 8 x 5 mm nodular density in the left upper midlung zone
with a confluence of the anterior aspect of the left third rib in
the posterior aspect of the left fifth rib. This could represent a
pulmonary nodule or bone island. There are no prior studies of that
area of available for comparison.

Heart size and pulmonary vascularity are normal. No infiltrates or
effusions. No osseous abnormalities.
IMPRESSION: Small nodular density in the left upper lung zone. CT scan of the
chest without contrast recommended for further evaluation of this
possible lesion.

## 2015-02-06 ENCOUNTER — Inpatient Hospital Stay (HOSPITAL_COMMUNITY)
Admission: AD | Admit: 2015-02-06 | Discharge: 2015-02-12 | DRG: 464 | Disposition: A | Discharge status: Home Health | Payer: Medicare Other | Source: Other Acute Inpatient Hospital | Attending: Orthopedic Surgery | Admitting: Orthopedic Surgery

## 2015-02-06 ENCOUNTER — Inpatient Hospital Stay (HOSPITAL_COMMUNITY): Payer: Medicare Other | Admitting: Anesthesiology

## 2015-02-06 ENCOUNTER — Encounter (HOSPITAL_COMMUNITY)
Admission: AD | Disposition: A | Discharge status: Home Health | Payer: Self-pay | Source: Other Acute Inpatient Hospital | Attending: Orthopedic Surgery

## 2015-02-06 DIAGNOSIS — K219 Gastro-esophageal reflux disease without esophagitis: Secondary | ICD-10-CM | POA: Diagnosis present

## 2015-02-06 DIAGNOSIS — Y831 Surgical operation with implant of artificial internal device as the cause of abnormal reaction of the patient, or of later complication, without mention of misadventure at the time of the procedure: Secondary | ICD-10-CM | POA: Diagnosis present

## 2015-02-06 DIAGNOSIS — R7881 Bacteremia: Secondary | ICD-10-CM | POA: Diagnosis not present

## 2015-02-06 DIAGNOSIS — Z8673 Personal history of transient ischemic attack (TIA), and cerebral infarction without residual deficits: Secondary | ICD-10-CM

## 2015-02-06 DIAGNOSIS — Z79899 Other long term (current) drug therapy: Secondary | ICD-10-CM | POA: Diagnosis not present

## 2015-02-06 DIAGNOSIS — M00262 Other streptococcal arthritis, left knee: Secondary | ICD-10-CM | POA: Diagnosis present

## 2015-02-06 DIAGNOSIS — I10 Essential (primary) hypertension: Secondary | ICD-10-CM | POA: Diagnosis present

## 2015-02-06 DIAGNOSIS — T8450XD Infection and inflammatory reaction due to unspecified internal joint prosthesis, subsequent encounter: Secondary | ICD-10-CM | POA: Diagnosis not present

## 2015-02-06 DIAGNOSIS — B951 Streptococcus, group B, as the cause of diseases classified elsewhere: Secondary | ICD-10-CM | POA: Diagnosis present

## 2015-02-06 DIAGNOSIS — E119 Type 2 diabetes mellitus without complications: Secondary | ICD-10-CM

## 2015-02-06 DIAGNOSIS — Z981 Arthrodesis status: Secondary | ICD-10-CM | POA: Diagnosis not present

## 2015-02-06 DIAGNOSIS — T8454XA Infection and inflammatory reaction due to internal left knee prosthesis, initial encounter: Secondary | ICD-10-CM | POA: Diagnosis present

## 2015-02-06 DIAGNOSIS — T8450XS Infection and inflammatory reaction due to unspecified internal joint prosthesis, sequela: Secondary | ICD-10-CM | POA: Diagnosis not present

## 2015-02-06 DIAGNOSIS — Z96659 Presence of unspecified artificial knee joint: Secondary | ICD-10-CM

## 2015-02-06 DIAGNOSIS — Z7982 Long term (current) use of aspirin: Secondary | ICD-10-CM | POA: Diagnosis not present

## 2015-02-06 DIAGNOSIS — T8453XD Infection and inflammatory reaction due to internal right knee prosthesis, subsequent encounter: Secondary | ICD-10-CM | POA: Diagnosis not present

## 2015-02-06 DIAGNOSIS — T8459XA Infection and inflammatory reaction due to other internal joint prosthesis, initial encounter: Secondary | ICD-10-CM

## 2015-02-06 DIAGNOSIS — A419 Sepsis, unspecified organism: Secondary | ICD-10-CM | POA: Diagnosis not present

## 2015-02-06 DIAGNOSIS — M25562 Pain in left knee: Secondary | ICD-10-CM | POA: Diagnosis present

## 2015-02-06 HISTORY — PX: I & D KNEE WITH POLY EXCHANGE: SHX5024

## 2015-02-06 LAB — GRAM STAIN

## 2015-02-06 LAB — SYNOVIAL CELL COUNT + DIFF, W/ CRYSTALS
Crystals, Fluid: NONE SEEN
EOSINOPHILS-SYNOVIAL: 0 % (ref 0–1)
Lymphocytes-Synovial Fld: 0 % (ref 0–20)
Monocyte-Macrophage-Synovial Fluid: 1 % — ABNORMAL LOW (ref 50–90)
NEUTROPHIL, SYNOVIAL: 99 % — AB (ref 0–25)
WBC, SYNOVIAL: 244000 /mm3 — AB (ref 0–200)

## 2015-02-06 LAB — GLUCOSE, CAPILLARY
GLUCOSE-CAPILLARY: 201 mg/dL — AB (ref 70–99)
GLUCOSE-CAPILLARY: 195 mg/dL — AB (ref 70–99)

## 2015-02-06 SURGERY — IRRIGATION AND DEBRIDEMENT KNEE WITH POLY EXCHANGE
Anesthesia: Regional | Site: Knee | Laterality: Left

## 2015-02-06 MED ORDER — ROPIVACAINE HCL 5 MG/ML IJ SOLN
INTRAMUSCULAR | Status: DC | PRN
  Administered 2015-02-06: 25 mL via PERINEURAL

## 2015-02-06 MED ORDER — ACETAMINOPHEN 500 MG PO TABS
ORAL_TABLET | ORAL | Status: AC
  Filled 2015-02-06: qty 2

## 2015-02-06 MED ORDER — MIDAZOLAM HCL 2 MG/2ML IJ SOLN
INTRAMUSCULAR | Status: AC
  Filled 2015-02-06: qty 2

## 2015-02-06 MED ORDER — OXYCODONE HCL 5 MG PO TABS
5.0000 mg | ORAL_TABLET | Freq: Once | ORAL | Status: AC | PRN
  Administered 2015-02-06: 5 mg via ORAL

## 2015-02-06 MED ORDER — NEOSTIGMINE METHYLSULFATE 10 MG/10ML IV SOLN
INTRAVENOUS | Status: AC
  Filled 2015-02-06: qty 1

## 2015-02-06 MED ORDER — PHENYLEPHRINE 40 MCG/ML (10ML) SYRINGE FOR IV PUSH (FOR BLOOD PRESSURE SUPPORT)
PREFILLED_SYRINGE | INTRAVENOUS | Status: AC
  Filled 2015-02-06: qty 10

## 2015-02-06 MED ORDER — HYDROMORPHONE HCL 1 MG/ML IJ SOLN
INTRAMUSCULAR | Status: AC
  Filled 2015-02-06: qty 1

## 2015-02-06 MED ORDER — GLYCOPYRROLATE 0.2 MG/ML IJ SOLN
INTRAMUSCULAR | Status: DC | PRN
  Administered 2015-02-06: 0.6 mg via INTRAVENOUS

## 2015-02-06 MED ORDER — MIDAZOLAM HCL 2 MG/2ML IJ SOLN
INTRAMUSCULAR | Status: AC
Start: 2015-02-06 — End: 2015-02-07
  Filled 2015-02-06: qty 2

## 2015-02-06 MED ORDER — OXYCHLOROSENE SODIUM POWD
Status: DC | PRN
  Administered 2015-02-06: 1

## 2015-02-06 MED ORDER — FENTANYL CITRATE 0.05 MG/ML IJ SOLN
INTRAMUSCULAR | Status: AC
  Filled 2015-02-06: qty 2

## 2015-02-06 MED ORDER — VANCOMYCIN HCL IN DEXTROSE 1-5 GM/200ML-% IV SOLN
INTRAVENOUS | Status: AC
  Filled 2015-02-06: qty 200

## 2015-02-06 MED ORDER — PHENYLEPHRINE HCL 10 MG/ML IJ SOLN
INTRAMUSCULAR | Status: DC | PRN
Start: 2015-02-06 — End: 2015-02-06
  Administered 2015-02-06 (×4): 40 ug via INTRAVENOUS

## 2015-02-06 MED ORDER — LACTATED RINGERS IV SOLN
INTRAVENOUS | Status: DC
  Administered 2015-02-06 (×2): via INTRAVENOUS

## 2015-02-06 MED ORDER — FENTANYL CITRATE 0.05 MG/ML IJ SOLN
50.0000 ug | INTRAMUSCULAR | Status: DC | PRN
  Administered 2015-02-06 (×3): 50 ug via INTRAVENOUS

## 2015-02-06 MED ORDER — OXYCODONE HCL 5 MG/5ML PO SOLN: 5.0000 mg | Freq: Once | ORAL | Status: AC | PRN

## 2015-02-06 MED ORDER — ACETAMINOPHEN 325 MG PO TABS: 325.0000 mg | ORAL_TABLET | ORAL | Status: DC | PRN

## 2015-02-06 MED ORDER — OXYCHLOROSENE SODIUM POWD
Freq: Once | Status: DC
Start: 2015-02-06 — End: 2015-02-07
  Filled 2015-02-06 (×2): qty 2

## 2015-02-06 MED ORDER — DEXAMETHASONE SODIUM PHOSPHATE 10 MG/ML IJ SOLN
INTRAMUSCULAR | Status: AC
  Filled 2015-02-06: qty 1

## 2015-02-06 MED ORDER — GLYCOPYRROLATE 0.2 MG/ML IJ SOLN
INTRAMUSCULAR | Status: AC
  Filled 2015-02-06: qty 4

## 2015-02-06 MED ORDER — NON FORMULARY
Status: DC | PRN
  Administered 2015-02-06: 200 mL

## 2015-02-06 MED ORDER — LIDOCAINE HCL (CARDIAC) 20 MG/ML IV SOLN
INTRAVENOUS | Status: AC
  Filled 2015-02-06: qty 5

## 2015-02-06 MED ORDER — ACETAMINOPHEN 160 MG/5ML PO SOLN: 325.0000 mg | ORAL | Status: DC | PRN

## 2015-02-06 MED ORDER — FENTANYL CITRATE 0.05 MG/ML IJ SOLN
INTRAMUSCULAR | Status: DC | PRN
  Administered 2015-02-06: 100 ug via INTRAVENOUS
  Administered 2015-02-06: 150 ug via INTRAVENOUS
  Administered 2015-02-06 (×2): 100 ug via INTRAVENOUS
  Administered 2015-02-06: 50 ug via INTRAVENOUS

## 2015-02-06 MED ORDER — FENTANYL CITRATE 0.05 MG/ML IJ SOLN
INTRAMUSCULAR | Status: AC
  Filled 2015-02-06: qty 5

## 2015-02-06 MED ORDER — ONDANSETRON HCL 4 MG/2ML IJ SOLN
INTRAMUSCULAR | Status: AC
  Filled 2015-02-06: qty 2

## 2015-02-06 MED ORDER — FENTANYL CITRATE 0.05 MG/ML IJ SOLN
INTRAMUSCULAR | Status: AC
  Administered 2015-02-06: 50 ug via INTRAVENOUS
  Filled 2015-02-06: qty 2

## 2015-02-06 MED ORDER — STERILE WATER FOR IRRIGATION IR SOLN
Status: DC | PRN
  Administered 2015-02-06: 950 mL

## 2015-02-06 MED ORDER — MIDAZOLAM HCL 2 MG/2ML IJ SOLN
1.0000 mg | INTRAMUSCULAR | Status: DC | PRN
  Administered 2015-02-06 (×2): 1 mg via INTRAVENOUS

## 2015-02-06 MED ORDER — PROPOFOL 10 MG/ML IV BOLUS
INTRAVENOUS | Status: AC
  Filled 2015-02-06: qty 20

## 2015-02-06 MED ORDER — SODIUM CHLORIDE 0.9 % IR SOLN
Status: DC | PRN
  Administered 2015-02-06: 3000 mL
  Administered 2015-02-06: 1000 mL
  Administered 2015-02-06: 3000 mL

## 2015-02-06 MED ORDER — OXYCODONE HCL 5 MG PO TABS
ORAL_TABLET | ORAL | Status: AC
  Filled 2015-02-06: qty 1

## 2015-02-06 MED ORDER — PROPOFOL 10 MG/ML IV BOLUS
INTRAVENOUS | Status: DC | PRN
  Administered 2015-02-06: 160 mg via INTRAVENOUS

## 2015-02-06 MED ORDER — ACETAMINOPHEN 500 MG PO TABS
1000.0000 mg | ORAL_TABLET | Freq: Once | ORAL | Status: AC
  Administered 2015-02-06: 1000 mg via ORAL
  Filled 2015-02-06: qty 2

## 2015-02-06 MED ORDER — METHOCARBAMOL 750 MG PO TABS
750.0000 mg | ORAL_TABLET | Freq: Three times a day (TID) | ORAL | Status: DC | PRN
  Administered 2015-02-07 – 2015-02-12 (×11): 750 mg via ORAL
  Filled 2015-02-06 (×14): qty 1

## 2015-02-06 MED ORDER — LIDOCAINE HCL (CARDIAC) 20 MG/ML IV SOLN
INTRAVENOUS | Status: DC | PRN
  Administered 2015-02-06: 60 mg via INTRAVENOUS

## 2015-02-06 MED ORDER — FENTANYL CITRATE 0.05 MG/ML IJ SOLN
INTRAMUSCULAR | Status: AC
Start: 2015-02-06 — End: 2015-02-06
  Filled 2015-02-06: qty 5

## 2015-02-06 MED ORDER — HYDROMORPHONE HCL 1 MG/ML IJ SOLN
0.2500 mg | INTRAMUSCULAR | Status: DC | PRN
  Administered 2015-02-06: 0.5 mg via INTRAVENOUS

## 2015-02-06 MED ORDER — ONDANSETRON HCL 4 MG/2ML IJ SOLN
INTRAMUSCULAR | Status: DC | PRN
  Administered 2015-02-06: 4 mg via INTRAVENOUS

## 2015-02-06 MED ORDER — ROCURONIUM BROMIDE 50 MG/5ML IV SOLN
INTRAVENOUS | Status: AC
  Filled 2015-02-06: qty 1

## 2015-02-06 MED ORDER — MIDAZOLAM HCL 2 MG/2ML IJ SOLN
INTRAMUSCULAR | Status: AC
  Administered 2015-02-06: 1 mg via INTRAVENOUS
  Filled 2015-02-06: qty 2

## 2015-02-06 MED ORDER — VANCOMYCIN HCL 1000 MG IV SOLR
1000.0000 mg | INTRAVENOUS | Status: DC | PRN
  Administered 2015-02-06: 1000 mg via INTRAVENOUS

## 2015-02-06 MED ORDER — CEFAZOLIN SODIUM-DEXTROSE 2-3 GM-% IV SOLR
INTRAVENOUS | Status: AC
  Filled 2015-02-06: qty 50

## 2015-02-06 MED ORDER — ROCURONIUM BROMIDE 100 MG/10ML IV SOLN
INTRAVENOUS | Status: DC | PRN
  Administered 2015-02-06: 10 mg via INTRAVENOUS
  Administered 2015-02-06: 40 mg via INTRAVENOUS

## 2015-02-06 MED ORDER — NEOSTIGMINE METHYLSULFATE 10 MG/10ML IV SOLN
INTRAVENOUS | Status: DC | PRN
Start: 2015-02-06 — End: 2015-02-06
  Administered 2015-02-06: 3 mg via INTRAVENOUS

## 2015-02-06 MED ORDER — HYDROMORPHONE HCL 1 MG/ML IJ SOLN
0.2500 mg | INTRAMUSCULAR | Status: DC | PRN
  Administered 2015-02-06 (×3): 0.5 mg via INTRAVENOUS

## 2015-02-06 MED ORDER — CEFAZOLIN SODIUM-DEXTROSE 2-3 GM-% IV SOLR
INTRAVENOUS | Status: DC | PRN
  Administered 2015-02-06: 2 g via INTRAVENOUS

## 2015-02-06 SURGICAL SUPPLY — 75 items
BAG DECANTER FOR FLEXI CONT (MISCELLANEOUS) IMPLANT
BANDAGE ELASTIC 4 VELCRO ST LF (GAUZE/BANDAGES/DRESSINGS) ×1 IMPLANT
BANDAGE ELASTIC 6 VELCRO ST LF (GAUZE/BANDAGES/DRESSINGS) ×1 IMPLANT
BANDAGE ESMARK 6X9 LF (GAUZE/BANDAGES/DRESSINGS) IMPLANT
BLADE SURG 10 STRL SS (BLADE) ×4 IMPLANT
BNDG CMPR 9X6 STRL LF SNTH (GAUZE/BANDAGES/DRESSINGS) ×1
BNDG CMPR MED 15X6 ELC VLCR LF (GAUZE/BANDAGES/DRESSINGS) ×1
BNDG COHESIVE 4X5 TAN STRL (GAUZE/BANDAGES/DRESSINGS) ×1 IMPLANT
BNDG COHESIVE 6X5 TAN STRL LF (GAUZE/BANDAGES/DRESSINGS) ×2 IMPLANT
BNDG ELASTIC 6X15 VLCR STRL LF (GAUZE/BANDAGES/DRESSINGS) ×2 IMPLANT
BNDG ESMARK 6X9 LF (GAUZE/BANDAGES/DRESSINGS) ×3
BNDG GAUZE ELAST 4 BULKY (GAUZE/BANDAGES/DRESSINGS) ×3 IMPLANT
CONT SPEC 4OZ CLIKSEAL STRL BL (MISCELLANEOUS) ×2 IMPLANT
CUFF TOURNIQUET SINGLE 18IN (TOURNIQUET CUFF) ×1 IMPLANT
CUFF TOURNIQUET SINGLE 24IN (TOURNIQUET CUFF) IMPLANT
CUFF TOURNIQUET SINGLE 34IN LL (TOURNIQUET CUFF) ×2 IMPLANT
CUFF TOURNIQUET SINGLE 44IN (TOURNIQUET CUFF) IMPLANT
DRAPE IMP U-DRAPE 54X76 (DRAPES) ×3 IMPLANT
DRAPE ORTHO SPLIT 77X108 STRL (DRAPES) ×6
DRAPE STERI 35X30 U-POUCH (DRAPES) IMPLANT
DRAPE SURG ORHT 6 SPLT 77X108 (DRAPES) IMPLANT
DRAPE U-SHAPE 47X51 STRL (DRAPES) ×3 IMPLANT
DRSG ADAPTIC 3X8 NADH LF (GAUZE/BANDAGES/DRESSINGS) ×3 IMPLANT
DRSG EMULSION OIL 3X3 NADH (GAUZE/BANDAGES/DRESSINGS) ×1 IMPLANT
DRSG PAD ABDOMINAL 8X10 ST (GAUZE/BANDAGES/DRESSINGS) ×3 IMPLANT
DURAPREP 26ML APPLICATOR (WOUND CARE) ×3 IMPLANT
ELECT CAUTERY BLADE 6.4 (BLADE) IMPLANT
ELECT REM PT RETURN 9FT ADLT (ELECTROSURGICAL) ×3
ELECTRODE REM PT RTRN 9FT ADLT (ELECTROSURGICAL) IMPLANT
EVACUATOR 3/16  PVC DRAIN (DRAIN)
EVACUATOR 3/16 PVC DRAIN (DRAIN) IMPLANT
GAUZE SPONGE 4X4 12PLY STRL (GAUZE/BANDAGES/DRESSINGS) ×3 IMPLANT
GAUZE XEROFORM 1X8 LF (GAUZE/BANDAGES/DRESSINGS) ×1 IMPLANT
GAUZE XEROFORM 5X9 LF (GAUZE/BANDAGES/DRESSINGS) ×2 IMPLANT
GLOVE BIOGEL PI IND STRL 8 (GLOVE) ×2 IMPLANT
GLOVE BIOGEL PI INDICATOR 8 (GLOVE) ×4
GLOVE ECLIPSE 7.5 STRL STRAW (GLOVE) ×6 IMPLANT
GOWN STRL REUS W/ TWL LRG LVL3 (GOWN DISPOSABLE) ×2 IMPLANT
GOWN STRL REUS W/ TWL XL LVL3 (GOWN DISPOSABLE) ×2 IMPLANT
GOWN STRL REUS W/TWL LRG LVL3 (GOWN DISPOSABLE) ×6
GOWN STRL REUS W/TWL XL LVL3 (GOWN DISPOSABLE) ×6
HANDPIECE INTERPULSE COAX TIP (DISPOSABLE) ×3
IMMOBILIZER KNEE 24 THIGH 36 (MISCELLANEOUS) IMPLANT
IMMOBILIZER KNEE 24 UNIV (MISCELLANEOUS) ×3
KIT BASIN OR (CUSTOM PROCEDURE TRAY) ×3 IMPLANT
KIT ROOM TURNOVER OR (KITS) ×3 IMPLANT
MANIFOLD NEPTUNE II (INSTRUMENTS) ×3 IMPLANT
NS IRRIG 1000ML POUR BTL (IV SOLUTION) ×3 IMPLANT
PACK ORTHO EXTREMITY (CUSTOM PROCEDURE TRAY) ×3 IMPLANT
PACK UNIVERSAL I (CUSTOM PROCEDURE TRAY) ×3 IMPLANT
PAD ARMBOARD 7.5X6 YLW CONV (MISCELLANEOUS) ×6 IMPLANT
PAD CAST 4YDX4 CTTN HI CHSV (CAST SUPPLIES) ×1 IMPLANT
PADDING CAST COTTON 4X4 STRL (CAST SUPPLIES)
PLATE ROT INSERT 10MM SIZE 5 (Plate) ×2 IMPLANT
SET HNDPC FAN SPRY TIP SCT (DISPOSABLE) IMPLANT
SPONGE LAP 18X18 X RAY DECT (DISPOSABLE) ×5 IMPLANT
STAPLER VISISTAT 35W (STAPLE) ×2 IMPLANT
STOCKINETTE IMPERVIOUS 9X36 MD (GAUZE/BANDAGES/DRESSINGS) ×3 IMPLANT
SUT ETHILON 2 0 PSLX (SUTURE) ×2 IMPLANT
SUT PDS AB 1 CT  36 (SUTURE)
SUT PDS AB 1 CT 36 (SUTURE) ×2 IMPLANT
SUT PDS AB 2-0 CT1 27 (SUTURE) ×1 IMPLANT
SUT VIC AB 0 CT1 27 (SUTURE) ×3
SUT VIC AB 0 CT1 27XBRD ANBCTR (SUTURE) IMPLANT
SUT VIC AB 1 CT1 27 (SUTURE) ×6
SUT VIC AB 1 CT1 27XBRD ANBCTR (SUTURE) IMPLANT
SUT VIC AB 2-0 CT1 36 (SUTURE) ×4 IMPLANT
SWAB COLLECTION DEVICE MRSA (MISCELLANEOUS) ×2 IMPLANT
TOWEL OR 17X24 6PK STRL BLUE (TOWEL DISPOSABLE) ×3 IMPLANT
TOWEL OR 17X26 10 PK STRL BLUE (TOWEL DISPOSABLE) ×3 IMPLANT
TUBE ANAEROBIC SPECIMEN COL (MISCELLANEOUS) ×2 IMPLANT
TUBE CONNECTING 12'X1/4 (SUCTIONS) ×1
TUBE CONNECTING 12X1/4 (SUCTIONS) ×2 IMPLANT
UNDERPAD 30X30 INCONTINENT (UNDERPADS AND DIAPERS) ×1 IMPLANT
YANKAUER SUCT BULB TIP NO VENT (SUCTIONS) ×3 IMPLANT

## 2015-02-06 NOTE — Progress Notes (Signed)
Dr. Luiz BlareGraves in to see pt, aware of elevated temperature, 1 gram PO Tylenol given.

## 2015-02-06 NOTE — H&P (Signed)
PREOPERATIVE H&P  Chief Complaint: left knee pain  HPI: Cameron [name] is a 64 y.o. male who presents for evaluation of severe left knee pain. It has been present for less than 24 hours and has been worsening.  The patient underwent left total knee replacement approximately 5 weeks ago.  He was doing great up until early this morning when he awoke with severe pain in the left knee.  He went to the emergency room and states he'll with severe pain.  He was afebrile there.  He did have a white count of 18,000.  Because of the severe pain and recent onset he underwent ultrasound examination which was negative.  I spoke with the emergency room physician and asked him to aspirate the knee.  Aspirate came back with gram-positive cocci and 173,000 white blood cells.  I asked him to transfer him down for immediate irrigation and debridement and poly-swab.  He'll be transferred via EMS and L taken to the operating room once he arrives the [place]. He has failed conservative measures. Pain is rated as severe.  Past Medical History  Diagnosis Date  . Hypertension   . Diabetes mellitus without complication   . GERD (gastroesophageal reflux disease)   . H/O renal calculi     passed several on his own, also has had procedures for    . Arthritis     in the knees, hands & neck   . Stroke     "small stroke" 10/2013, states he had care at [facility]   Past Surgical History  Procedure Laterality Date  . Back surgery  1995    fusion- [facility]   . Appendectomy  2005  . Cholecystectomy    . Knee arthroscopy Left   . Total knee arthroplasty Left 12/31/2014    Procedure: TOTAL KNEE ARTHROPLASTY;  Surgeon: Harvie Junior, MD;  Location: [facility] OR;  Service: Orthopedics;  Laterality: Left;  Left knee arthroplasty   History   Social History  . Marital Status: Single    Spouse Name: N/A  . Number of Children: N/A  . Years of Education: N/A   Social History Main Topics  . Smoking  status: Former Smoker    Quit date: 12/18/1973  . Smokeless tobacco: Not on file  . Alcohol Use: No  . Drug Use: No  . Sexual Activity: Not on file   Other Topics Concern  . Not on file   Social History Narrative   No family history on file. No Known Allergies Prior to Admission medications   Medication Sig Start Date End Date Taking? Authorizing Provider  amLODipine (NORVASC) 2.5 MG tablet Take 2.5 mg by mouth at bedtime.     Historical Provider, MD  aspirin EC 325 MG tablet Take 1 tablet (325 mg total) by mouth 2 (two) times daily after a meal. 12/31/14   Matthew Folks, PA-C  finasteride (PROSCAR) 5 MG tablet Take 1 tablet by mouth daily. 11/19/14   Historical Provider, MD  glimepiride (AMARYL) 4 MG tablet Take 4 mg by mouth 2 (two) times daily.    Historical Provider, MD  guaiFENesin (MUCINEX) 600 MG 12 hr tablet Take 600 mg by mouth 2 (two) times daily.    Historical Provider, MD  metFORMIN (GLUCOPHAGE) 1000 MG tablet Take 2,000 mg by mouth at bedtime.     Historical Provider, MD  methocarbamol (ROBAXIN-750) 750 MG tablet Take 1 tablet (750 mg total) by mouth every 8 (eight) hours as needed for  muscle spasms. 12/31/14   Matthew FolksJames G Bethune, PA-C  omeprazole (PRILOSEC) 20 MG capsule Take 20 mg by mouth at bedtime.     Historical Provider, MD  oxyCODONE-acetaminophen (PERCOCET/ROXICET) 5-325 MG per tablet Take 1-2 tablets by mouth every 4 (four) hours as needed for severe pain. 12/31/14   Matthew FolksJames G Bethune, PA-C  Red Yeast Rice Extract (RED YEAST RICE PO) Take 2 tablets by mouth every morning.    Historical Provider, MD  sitaGLIPtin (JANUVIA) 100 MG tablet Take 100 mg by mouth daily before breakfast.     Historical Provider, MD     Positive ROS: none  All other systems have been reviewed and were otherwise negative with the exception of those mentioned in the HPI and as above.  Physical Exam: There were no vitals filed for this visit.  General: Alert, no acute distress Cardiovascular:  No pedal edema Respiratory: No cyanosis, no use of accessory musculature GI: No organomegaly, abdomen is soft and non-tender Skin: No lesions in the area of chief complaint Neurologic: Sensation intact distally Psychiatric: Patient is competent for consent with normal mood and affect Lymphatic: No axillary or cervical lymphadenopathy  MUSCULOSKELETAL: left knee: Extremely painful range of motion.  Mild erythema and warmth.  No instability.  Assessment/Plan: infected total knee left Plan for Procedure(s): IRRIGATION AND DEBRIDEMENT KNEE WITH POLY EXCHANGE left knee  The risks benefits and alternatives were discussed with the patient including but not limited to the risks of nonoperative treatment, versus surgical intervention including infection, bleeding, nerve injury, malunion, nonunion, hardware prominence, hardware failure, need for hardware removal, blood clots, cardiopulmonary complications, morbidity, mortality, among others, and they were willing to proceed.  Predicted outcome is good, although there will be at least a six to nine month expected recovery.  Harvie JuniorGRAVES,Abbeygail Igoe L, MD 02/06/2015 4:51 PM

## 2015-02-06 NOTE — Progress Notes (Signed)
Upper denture labelled and taken to PACU by Domenica Failomika Jones, RN.

## 2015-02-06 NOTE — Progress Notes (Signed)
MD Graves notified pt has been difficult to arouse at times, and when awake very combative and unable to follow simple commands or cooperate.  Pt had to be placed in bilateral wrist restraints and 1mg  IV Versed was administered.  MD gave verbal order for both the bilateral wrist restraints and changing the hospital admission request to a stepdown unit where the pt can be monitored more closely.

## 2015-02-06 NOTE — Brief Op Note (Signed)
02/06/2015  7:24 PM  PATIENT:  Chrissie NoaWilliam P Vanvleck  64 y.o. male  PRE-OPERATIVE DIAGNOSIS:  infected total knee  POST-OPERATIVE DIAGNOSIS:  infected total knee  PROCEDURE:  Procedure(s): IRRIGATION AND DEBRIDEMENT LEFT KNEE WITH POLY EXCHANGE (Left)  SURGEON:  Surgeon(s) and Role:    * Harvie JuniorJohn L Kara Mierzejewski, MD - Primary  PHYSICIAN ASSISTANT:   ASSISTANTS: bethune   ANESTHESIA:   general  EBL:  Total I/O In: 800 [I.V.:800] Out: -   BLOOD ADMINISTERED:none  DRAINS: (2 large) Hemovact drain(s) in the l knee with  Suction Open   LOCAL MEDICATIONS USED:  NONE  SPECIMEN:  Source of Specimen:  l knee fluid  DISPOSITION OF SPECIMEN:  N/A  COUNTS:  YES  TOURNIQUET:   Total Tourniquet Time Documented: Thigh (Left) - 44 minutes Total: Thigh (Left) - 44 minutes   DICTATION: .Other Dictation: Dictation Number [id number]  PLAN OF CARE: Admit to inpatient   PATIENT DISPOSITION:  PACU - hemodynamically stable.   Delay start of Pharmacological VTE agent (>24hrs) due to surgical blood loss or risk of bleeding: no

## 2015-02-06 NOTE — Progress Notes (Signed)
Pt arrived via EMS from [facility] in [place], [place] reporting severe pain in left knee that started this morning around 0500. Pt had surgery here by Dr. Luiz BlareGraves in January 2016. Left knee slightly swollen with small area of redness noted. Pt drowsy but oriented. Placed on cardiac monitor and O2 at 2L via nasal cannula, continuous pulse ox.

## 2015-02-06 NOTE — Transfer of Care (Signed)
Immediate Anesthesia Transfer of Care Note  Patient: Cameron [name]  Procedure(s) Performed: Procedure(s): IRRIGATION AND DEBRIDEMENT LEFT KNEE WITH POLY EXCHANGE (Left)  Patient Location: PACU  Anesthesia Type:General  Level of Consciousness: sedated  Airway & Oxygen Therapy: Patient Spontanous Breathing and Patient connected to nasal cannula oxygen  Post-op Assessment: Report given to RN and Post -op Vital signs reviewed and stable  Post vital signs: stable  Last Vitals:  Filed Vitals:   02/06/15 2000  BP:   Pulse:   Temp:   Resp: 28    Complications: No apparent anesthesia complications

## 2015-02-06 NOTE — Progress Notes (Signed)
IV inserted at [facility] emergency room.

## 2015-02-06 NOTE — Anesthesia Procedure Notes (Addendum)
Anesthesia Regional Block:  Femoral nerve block  Pre-Anesthetic Checklist: ,, timeout performed, Correct Patient, Correct Site, Correct Laterality, Correct Procedure, Correct Position, site marked, Risks and benefits discussed,  Surgical consent,  Pre-op evaluation,  At surgeon's request and post-op pain management  Laterality: Lower and Left  Prep: chloraprep       Needles:  Injection technique: Single-shot  Needle Type: Echogenic Stimulator Needle          Additional Needles:  Procedures: ultrasound guided (picture in chart) and nerve stimulator Femoral nerve block  Nerve Stimulator or Paresthesia:  Response: quad, 0.5 mA,   Additional Responses:   Narrative:  Injection made incrementally with aspirations every 5 mL.  Performed by: Personally  Anesthesiologist: MOSER, CHRIS  Additional Notes: H+P and labs reviewed, risks and benefits discussed with patient, procedure tolerated well without complications      

## 2015-02-06 NOTE — Anesthesia Preprocedure Evaluation (Signed)
Anesthesia Evaluation  Patient identified by MRN, date of birth, ID band Patient awake    Reviewed: Allergy & Precautions, NPO status , Patient's Chart, lab work & pertinent test results  History of Anesthesia Complications Negative for: history of anesthetic complications  Airway Mallampati: II  TM Distance: >3 FB Neck ROM: Full    Dental  (+) Edentulous Upper,    Pulmonary neg shortness of breath, neg sleep apnea, neg COPDneg recent URI, former smoker,  breath sounds clear to auscultation        Cardiovascular hypertension, Pt. on medications Rhythm:Regular     Neuro/Psych CVA, No Residual Symptoms negative psych ROS   GI/Hepatic Neg liver ROS, GERD-  Medicated and Controlled,  Endo/Other  diabetes, Type 2, Oral Hypoglycemic Agents  Renal/GU negative Renal ROS     Musculoskeletal  (+) Arthritis -, Osteoarthritis,    Abdominal   Peds  Hematology   Anesthesia Other Findings Infected left knee s/p tka  Reproductive/Obstetrics                             Anesthesia Physical Anesthesia Plan  ASA: III  Anesthesia Plan: General and Regional   Post-op Pain Management:    Induction: Intravenous  Airway Management Planned: LMA  Additional Equipment: None  Intra-op Plan:   Post-operative Plan: Extubation in OR  Informed Consent: I have reviewed the patients History and Physical, chart, labs and discussed the procedure including the risks, benefits and alternatives for the proposed anesthesia with the patient or authorized representative who has indicated his/her understanding and acceptance.   Dental advisory given  Plan Discussed with: CRNA and Surgeon  Anesthesia Plan Comments:         Anesthesia Quick Evaluation

## 2015-02-06 NOTE — Progress Notes (Signed)
Ortho to PACU to place CPM on L Leg.  Informed ortho tech pt is combative and confused.  Ortho tech will wait till pt is more alert to place CPM.

## 2015-02-06 NOTE — Progress Notes (Signed)
Pt taken back to OR

## 2015-02-07 DIAGNOSIS — Y839 Surgical procedure, unspecified as the cause of abnormal reaction of the patient, or of later complication, without mention of misadventure at the time of the procedure: Secondary | ICD-10-CM

## 2015-02-07 DIAGNOSIS — T8454XA Infection and inflammatory reaction due to internal left knee prosthesis, initial encounter: Principal | ICD-10-CM

## 2015-02-07 LAB — CBC
HEMATOCRIT: 31.5 % — AB (ref 39.0–52.0)
Hemoglobin: 10.2 g/dL — ABNORMAL LOW (ref 13.0–17.0)
MCH: 30.2 pg (ref 26.0–34.0)
MCHC: 32.4 g/dL (ref 30.0–36.0)
MCV: 93.2 fL (ref 78.0–100.0)
Platelets: 160 10*3/uL (ref 150–400)
RBC: 3.38 MIL/uL — ABNORMAL LOW (ref 4.22–5.81)
RDW: 13.6 % (ref 11.5–15.5)
WBC: 15.2 10*3/uL — ABNORMAL HIGH (ref 4.0–10.5)

## 2015-02-07 LAB — MRSA PCR SCREENING: MRSA by PCR: NEGATIVE

## 2015-02-07 LAB — BASIC METABOLIC PANEL
Anion gap: 4 — ABNORMAL LOW (ref 5–15)
BUN: 10 mg/dL (ref 6–23)
CO2: 29 mmol/L (ref 19–32)
Calcium: 7.8 mg/dL — ABNORMAL LOW (ref 8.4–10.5)
Chloride: 104 mmol/L (ref 96–112)
Creatinine, Ser: 0.98 mg/dL (ref 0.50–1.35)
GFR calc Af Amer: >90 mL/min (ref >90)
GFR calc non Af Amer: 84 mL/min — ABNORMAL LOW (ref >90)
Glucose, Bld: 191 mg/dL — ABNORMAL HIGH (ref 70–99)
Potassium: 3.9 mmol/L (ref 3.5–5.1)
Sodium: 137 mmol/L (ref 135–145)

## 2015-02-07 LAB — GLUCOSE, CAPILLARY
GLUCOSE-CAPILLARY: 220 mg/dL — AB (ref 70–99)
GLUCOSE-CAPILLARY: 171 mg/dL — AB (ref 70–99)
GLUCOSE-CAPILLARY: 194 mg/dL — AB (ref 70–99)

## 2015-02-07 MED ORDER — BISACODYL 5 MG PO TBEC: 5.0000 mg | DELAYED_RELEASE_TABLET | Freq: Every day | ORAL | Status: DC | PRN

## 2015-02-07 MED ORDER — PROMETHAZINE HCL 25 MG/ML IJ SOLN: 12.5000 mg | Freq: Four times a day (QID) | INTRAMUSCULAR | Status: DC | PRN

## 2015-02-07 MED ORDER — ALUM & MAG HYDROXIDE-SIMETH 200-200-20 MG/5ML PO SUSP: 30.0000 mL | ORAL | Status: DC | PRN

## 2015-02-07 MED ORDER — ASPIRIN EC 325 MG PO TBEC
325.0000 mg | DELAYED_RELEASE_TABLET | Freq: Two times a day (BID) | ORAL | Status: DC
  Administered 2015-02-07: 325 mg via ORAL
  Filled 2015-02-07 (×3): qty 1

## 2015-02-07 MED ORDER — AMLODIPINE BESYLATE 2.5 MG PO TABS
2.5000 mg | ORAL_TABLET | Freq: Every day | ORAL | Status: DC
  Administered 2015-02-07 – 2015-02-11 (×5): 2.5 mg via ORAL
  Filled 2015-02-07 (×6): qty 1

## 2015-02-07 MED ORDER — CEFAZOLIN SODIUM-DEXTROSE 2-3 GM-% IV SOLR
2.0000 g | Freq: Three times a day (TID) | INTRAVENOUS | Status: DC
  Administered 2015-02-07 – 2015-02-08 (×5): 2 g via INTRAVENOUS
  Filled 2015-02-07 (×8): qty 50

## 2015-02-07 MED ORDER — VANCOMYCIN HCL 10 G IV SOLR
1750.0000 mg | Freq: Once | INTRAVENOUS | Status: AC
  Administered 2015-02-07: 1750 mg via INTRAVENOUS
  Filled 2015-02-07: qty 1750

## 2015-02-07 MED ORDER — SODIUM CHLORIDE 0.9 % IV SOLN
INTRAVENOUS | Status: DC
  Administered 2015-02-07 – 2015-02-08 (×3): via INTRAVENOUS

## 2015-02-07 MED ORDER — ASPIRIN EC 81 MG PO TBEC
81.0000 mg | DELAYED_RELEASE_TABLET | Freq: Every day | ORAL | Status: DC
  Administered 2015-02-08 – 2015-02-12 (×5): 81 mg via ORAL
  Filled 2015-02-07 (×7): qty 1

## 2015-02-07 MED ORDER — DOCUSATE SODIUM 100 MG PO CAPS
100.0000 mg | ORAL_CAPSULE | Freq: Two times a day (BID) | ORAL | Status: DC
  Administered 2015-02-07 – 2015-02-11 (×10): 100 mg via ORAL
  Filled 2015-02-07 (×14): qty 1

## 2015-02-07 MED ORDER — CETYLPYRIDINIUM CHLORIDE 0.05 % MT LIQD
7.0000 mL | Freq: Two times a day (BID) | OROMUCOSAL | Status: DC
  Administered 2015-02-07 – 2015-02-12 (×9): 7 mL via OROMUCOSAL

## 2015-02-07 MED ORDER — PANTOPRAZOLE SODIUM 40 MG PO TBEC
40.0000 mg | DELAYED_RELEASE_TABLET | Freq: Every day | ORAL | Status: DC
  Administered 2015-02-07 – 2015-02-12 (×6): 40 mg via ORAL
  Filled 2015-02-07 (×6): qty 1

## 2015-02-07 MED ORDER — POLYETHYLENE GLYCOL 3350 17 G PO PACK
17.0000 g | PACK | Freq: Every day | ORAL | Status: DC | PRN
  Administered 2015-02-09: 17 g via ORAL
  Filled 2015-02-07 (×3): qty 1

## 2015-02-07 MED ORDER — GLIMEPIRIDE 4 MG PO TABS
4.0000 mg | ORAL_TABLET | Freq: Two times a day (BID) | ORAL | Status: DC
  Administered 2015-02-07 – 2015-02-11 (×10): 4 mg via ORAL
  Filled 2015-02-07 (×13): qty 1

## 2015-02-07 MED ORDER — DIPHENHYDRAMINE HCL 12.5 MG/5ML PO ELIX
12.5000 mg | ORAL_SOLUTION | ORAL | Status: DC | PRN
Start: 2015-02-07 — End: 2015-02-12
  Administered 2015-02-07: 25 mg via ORAL
  Filled 2015-02-07 (×2): qty 10

## 2015-02-07 MED ORDER — ONDANSETRON HCL 4 MG/2ML IJ SOLN
4.0000 mg | Freq: Four times a day (QID) | INTRAMUSCULAR | Status: DC | PRN
  Administered 2015-02-07: 4 mg via INTRAVENOUS
  Filled 2015-02-07 (×2): qty 2

## 2015-02-07 MED ORDER — FINASTERIDE 5 MG PO TABS
5.0000 mg | ORAL_TABLET | Freq: Every day | ORAL | Status: DC
  Administered 2015-02-07 – 2015-02-12 (×6): 5 mg via ORAL
  Filled 2015-02-07 (×8): qty 1

## 2015-02-07 MED ORDER — ACETAMINOPHEN 325 MG PO TABS
650.0000 mg | ORAL_TABLET | Freq: Four times a day (QID) | ORAL | Status: DC | PRN
  Administered 2015-02-07 – 2015-02-08 (×4): 650 mg via ORAL
  Filled 2015-02-07 (×4): qty 2

## 2015-02-07 MED ORDER — METFORMIN HCL 500 MG PO TABS
2000.0000 mg | ORAL_TABLET | Freq: Every day | ORAL | Status: DC
  Administered 2015-02-07 – 2015-02-11 (×5): 2000 mg via ORAL
  Filled 2015-02-07 (×8): qty 4

## 2015-02-07 MED ORDER — HYDROMORPHONE HCL 1 MG/ML IJ SOLN
1.0000 mg | INTRAMUSCULAR | Status: DC | PRN
  Administered 2015-02-07 (×2): 2 mg via INTRAVENOUS
  Administered 2015-02-07: 1 mg via INTRAVENOUS
  Administered 2015-02-07 (×3): 2 mg via INTRAVENOUS
  Administered 2015-02-08 (×2): 1 mg via INTRAVENOUS
  Administered 2015-02-08 (×2): 2 mg via INTRAVENOUS
  Administered 2015-02-09 – 2015-02-10 (×3): 1 mg via INTRAVENOUS
  Administered 2015-02-10: 2 mg via INTRAVENOUS
  Administered 2015-02-11 – 2015-02-12 (×6): 1 mg via INTRAVENOUS
  Filled 2015-02-07: qty 2
  Filled 2015-02-07: qty 1
  Filled 2015-02-07 (×2): qty 2
  Filled 2015-02-07 (×3): qty 1
  Filled 2015-02-07 (×5): qty 2
  Filled 2015-02-07 (×3): qty 1
  Filled 2015-02-07 (×2): qty 2
  Filled 2015-02-07 (×4): qty 1
  Filled 2015-02-07: qty 2

## 2015-02-07 MED ORDER — INSULIN ASPART 100 UNIT/ML ~~LOC~~ SOLN
0.0000 [IU] | Freq: Three times a day (TID) | SUBCUTANEOUS | Status: DC
  Administered 2015-02-09 – 2015-02-10 (×3): 3 [IU] via SUBCUTANEOUS
  Administered 2015-02-10 (×2): 2 [IU] via SUBCUTANEOUS
  Administered 2015-02-11: 5 [IU] via SUBCUTANEOUS
  Administered 2015-02-11: 3 [IU] via SUBCUTANEOUS
  Administered 2015-02-11: 2 [IU] via SUBCUTANEOUS
  Administered 2015-02-12: 5 [IU] via SUBCUTANEOUS
  Administered 2015-02-12: 100 [IU] via SUBCUTANEOUS
  Administered 2015-02-12: 3 [IU] via SUBCUTANEOUS

## 2015-02-07 MED ORDER — GUAIFENESIN ER 600 MG PO TB12
600.0000 mg | ORAL_TABLET | Freq: Two times a day (BID) | ORAL | Status: DC
  Administered 2015-02-07 – 2015-02-12 (×11): 600 mg via ORAL
  Filled 2015-02-07 (×15): qty 1

## 2015-02-07 MED ORDER — VANCOMYCIN HCL IN DEXTROSE 1-5 GM/200ML-% IV SOLN
1000.0000 mg | Freq: Two times a day (BID) | INTRAVENOUS | Status: DC
  Administered 2015-02-07 – 2015-02-08 (×3): 1000 mg via INTRAVENOUS
  Filled 2015-02-07 (×3): qty 200

## 2015-02-07 MED ORDER — IBUPROFEN 200 MG PO TABS
800.0000 mg | ORAL_TABLET | Freq: Three times a day (TID) | ORAL | Status: DC | PRN
Start: 2015-02-07 — End: 2015-02-12
  Administered 2015-02-09 – 2015-02-12 (×5): 800 mg via ORAL
  Filled 2015-02-07 (×5): qty 4

## 2015-02-07 MED ORDER — FLEET ENEMA 7-19 GM/118ML RE ENEM
1.0000 | ENEMA | Freq: Once | RECTAL | Status: AC | PRN
  Filled 2015-02-07: qty 1

## 2015-02-07 MED ORDER — ONDANSETRON HCL 4 MG PO TABS: 4.0000 mg | ORAL_TABLET | Freq: Four times a day (QID) | ORAL | Status: DC | PRN

## 2015-02-07 MED ORDER — ACETAMINOPHEN 650 MG RE SUPP: 650.0000 mg | Freq: Four times a day (QID) | RECTAL | Status: DC | PRN

## 2015-02-07 MED ORDER — OXYCODONE-ACETAMINOPHEN 5-325 MG PO TABS
1.0000 | ORAL_TABLET | ORAL | Status: DC | PRN
  Administered 2015-02-07 – 2015-02-12 (×20): 2 via ORAL
  Filled 2015-02-07 (×22): qty 2

## 2015-02-07 MED ORDER — LINAGLIPTIN 5 MG PO TABS
5.0000 mg | ORAL_TABLET | Freq: Every day | ORAL | Status: DC
  Administered 2015-02-07 – 2015-02-11 (×5): 5 mg via ORAL
  Filled 2015-02-07 (×7): qty 1

## 2015-02-07 NOTE — Progress Notes (Signed)
  Outpatient Parenteral Antimicrobial Therapy (OPAT) Pharmacy Monitoring  Guidelines developed by the Infectious Diseases Society of America (IDSA) recommend that hospitals who send patients home on intravenous (IV) antibiotics have an active performance improvement program.  IDSA guidelines also recommend that an infection specialist, such as a pharmacist, be involved in the evaluation of candidates for discharge with OPAT.      Yes No Comments  Assessed patient for appropriateness of OPAT? [x]  []  Is IV antimicrobial therapy needed? Is the home environment safe and adequate to support care? Are the patient/caregivers willing to participate and able to safely, effectively, and reliably deliver parenteral antimicrobial therapy? Is communication about problems and monitoring of therapy in place with patient? Does the patient/caregiver understand the benefits/risks with OPAT?  Are oral antibiotics an option? []  [x]  Patients diagnosed with endocarditis are not candidates for oral antibiotics.  Is antibiotic selected the best option for treatment indication? [x]  []  Can therapy be de-escalated further?  Is the dose appropriate? [x]  []  Consider renal function and administration schedule for outpatient setting.  Is the length of therapy appropriate? [x]  []  Consider disease-specific guidelines when available.  Recommend changing antibiotic regimen? []  [x]  If yes, recommend _________.    Final antibiotic regimen for discharge is ceftriaxone 2 gm IV daily for 6 weeks (include drug, dose, route, frequency and duration).  Laboratory monitoring and follow-up for discharge with OPAT: CBC/BMET weekly, follow-up in [facility].   Thank you for allowing pharmacy to be a part of this patient's care team.  Megyn Leng L. Roseanne RenoStewart, PharmD Clinical Pharmacy Resident Pager: [phone] 02/12/2015 1:33 PM

## 2015-02-07 NOTE — Anesthesia Postprocedure Evaluation (Signed)
  Anesthesia Post-op Note  Patient: Cameron [name]  Procedure(s) Performed: Procedure(s): IRRIGATION AND DEBRIDEMENT LEFT KNEE WITH POLY EXCHANGE (Left)  Patient Location: PACU  Anesthesia Type:General and Regional  Level of Consciousness: awake and confused  Airway and Oxygen Therapy: Patient Spontanous Breathing  Post-op Pain: mild  Post-op Assessment: Post-op Vital signs reviewed, Patient's Cardiovascular Status Stable, Respiratory Function Stable, Patent Airway, No signs of Nausea or vomiting and Pain level controlled  Post-op Vital Signs: Reviewed and stable  Last Vitals:  Filed Vitals:   02/07/15 0600  BP: 134/83  Pulse: 106  Temp:   Resp: 12    Complications: No apparent anesthesia complications

## 2015-02-07 NOTE — Progress Notes (Signed)
Subjective: 1 Day Post-Op Procedure(s) (LRB): IRRIGATION AND DEBRIDEMENT LEFT KNEE WITH POLY EXCHANGE (Left) Patient reports pain as moderate.  Patient is dramatically better than last night.  Objective: Vital signs in last 24 hours: Temp:  [98.4 F (36.9 C)-101.3 F (38.5 C)] 99.8 F (37.7 C) (02/11 0600) Pulse Rate:  [94-138] 97 (02/11 0800) Resp:  [9-34] 13 (02/11 0800) BP: (107-169)/(57-99) 125/76 mmHg (02/11 0800) SpO2:  [88 %-100 %] 100 % (02/11 0800) Weight:  [187 lb (84.823 kg)] 187 lb (84.823 kg) (02/10 1718)  Intake/Output from previous day: 02/10 0701 - 02/11 0700 In: 2528.3 [I.V.:2078.3; IV Piggyback:50] Out: 875 [Urine:775; Blood:100] Intake/Output this shift: Total I/O In: 225 [Other:225] Out: -    Recent Labs  02/07/15 0500  HGB 10.2*    Recent Labs  02/07/15 0500  WBC 15.2*  RBC 3.38*  HCT 31.5*  PLT 160    Recent Labs  02/07/15 0500  NA 137  K 3.9  CL 104  CO2 29  BUN 10  CREATININE 0.98  GLUCOSE 191*  CALCIUM 7.8*   No results for input(s): LABPT, INR in the last 72 hours.  Neurologically intact ABD soft Neurovascular intact Sensation intact distally Intact pulses distally No cellulitis present Compartment soft  Assessment/Plan: 1 Day Post-Op Procedure(s) (LRB): IRRIGATION AND DEBRIDEMENT LEFT KNEE WITH POLY EXCHANGE (Left) Advance diet Up with therapy Plan for discharge tomorrowIf we can arrange home IV antibiotic therapy and have a organism to treat.We'll await recommendations from infectious disease.  Pauletta Pickney L 02/07/2015, 8:26 AM

## 2015-02-07 NOTE — Progress Notes (Addendum)
ANTIBIOTIC CONSULT NOTE - INITIAL  Pharmacy Consult for Vancomycin  Indication: Left total knee infection   No Known Allergies  Patient Measurements: Height: 6' (182.9 cm) Weight: 187 lb (84.823 kg) IBW/kg (Calculated) : 77.6  Vital Signs: Temp: 98.6 F (37 C) (02/10 2145) Temp Source: Oral (02/10 1718) BP: 136/70 mmHg (02/10 2245) Pulse Rate: 113 (02/10 2330) Intake/Output from previous day: 02/10 0701 - 02/11 0700 In: 2000 [I.V.:1600] Out: 100 [Blood:100] Intake/Output from this shift: Total I/O In: 1800 [I.V.:1400; Other:400] Out: 100 [Blood:100]  Labs: No results for input(s): WBC, HGB, PLT, LABCREA, CREATININE in the last 72 hours. CrCl cannot be calculated (Patient has no serum creatinine result on file.). No results for input(s): VANCOTROUGH, VANCOPEAK, VANCORANDOM, GENTTROUGH, GENTPEAK, GENTRANDOM, TOBRATROUGH, TOBRAPEAK, TOBRARND, AMIKACINPEAK, AMIKACINTROU, AMIKACIN in the last 72 hours.   Microbiology: Recent Results (from the past 720 hour(s))  Gram stain     Status: None   Collection Time: 02/06/15  6:19 PM  Result Value Ref Range Status   Specimen Description SYNOVIAL FLUID KNEE LEFT  Final   Special Requests PATIENT ON FOLLOWING ROCEPHIN  Final   Gram Stain   Final    ABUNDANT WBC PRESENT,BOTH PMN AND MONONUCLEAR GRAM POSITIVE COCCI IN CLUSTERS IN PAIRS Gram Stain Report Called to,Read Back By and Verified With: DR GRAVES 1953 02/06/15 A BROWNING    Report Status 02/06/2015 FINAL  Final  Gram stain     Status: None   Collection Time: 02/06/15  6:26 PM  Result Value Ref Range Status   Specimen Description SYNOVIAL KNEE FLUID KNEE LEFT  Final   Special Requests   Final    PATIENT ON FOLLOWING ROCEPHIN DEEP KNEE RECEIVED SWAB   Gram Stain   Final    ABUNDANT WBC PRESENT,BOTH PMN AND MONONUCLEAR GRAM POSITIVE COCCI IN CHAINS IN CLUSTERS IN PAIRS Gram Stain Report Called to,Read Back By and Verified With: DR GRAVES 1953 02/06/15 A BROWNING    Report  Status 02/06/2015 FINAL  Final    Medical History: Past Medical History  Diagnosis Date  . Hypertension   . Diabetes mellitus without complication   . GERD (gastroesophageal reflux disease)   . H/O renal calculi     passed several on his own, also has had procedures for    . Arthritis     in the knees, hands & neck   . Stroke     "small stroke" 10/2013, states he had care at [facility]    Medications:  Prescriptions prior to admission  Medication Sig Dispense Refill Last Dose  . amLODipine (NORVASC) 2.5 MG tablet Take 2.5 mg by mouth at bedtime.    12/30/2014 at Unknown time  . aspirin EC 325 MG tablet Take 1 tablet (325 mg total) by mouth 2 (two) times daily after a meal. 60 tablet 0   . finasteride (PROSCAR) 5 MG tablet Take 1 tablet by mouth daily.  11 12/30/2014 at Unknown time  . glimepiride (AMARYL) 4 MG tablet Take 4 mg by mouth 2 (two) times daily.   12/30/2014 at Unknown time  . guaiFENesin (MUCINEX) 600 MG 12 hr tablet Take 600 mg by mouth 2 (two) times daily.   12/30/2014 at Unknown time  . metFORMIN (GLUCOPHAGE) 1000 MG tablet Take 2,000 mg by mouth at bedtime.    12/30/2014 at Unknown time  . methocarbamol (ROBAXIN-750) 750 MG tablet Take 1 tablet (750 mg total) by mouth every 8 (eight) hours as needed for muscle spasms. 50 tablet 0   .  omeprazole (PRILOSEC) 20 MG capsule Take 20 mg by mouth at bedtime.    Past Week at Unknown time  . oxyCODONE-acetaminophen (PERCOCET/ROXICET) 5-325 MG per tablet Take 1-2 tablets by mouth every 4 (four) hours as needed for severe pain. 60 tablet 0   . Red Yeast Rice Extract (RED YEAST RICE PO) Take 2 tablets by mouth every morning.   12/30/2014 at Unknown time  . sitaGLIPtin (JANUVIA) 100 MG tablet Take 100 mg by mouth daily before breakfast.    12/30/2014 at Unknown time   Assessment: [age] YOM who presented for evaluation of severe left knee pain. He had underwent left knee replacement approximately 5 weeks ago. On presentation to the ED at  [facility], he had WBC of 18K. Knee aspirate came back with gram positive cocci. [facility] was [facility] to [facility] and is currently s/p I&D of left knee. Pharmacy consulted to start empiric vancomycin. Labs ordered for AM.   Goal of Therapy:  Vancomycin trough level 15-20 mcg/ml  Plan:  -Give Vancomycin 1750 mg IV load dose -Enter maintenance Vanc dose in AM after labs result  -Monitor daily CBC, renal fx, cultures and s/s of clinical progress  -[facility]   Vinnie LevelBenjamin Mancheril, PharmD., BCPS Clinical Pharmacist Pager [phone]  Addendum: Scr is WNL and CrCl ~8182ml/min  Plan: 1. Vanc 1gm IV Q12H 2. F/u renal fxn, C&S, clinical status and [facility]  Jeyli Zwicker, PharmD, BCPS Pager # [phone] 02/07/2015 8:44 AM

## 2015-02-07 NOTE — Evaluation (Signed)
Physical Therapy Evaluation Patient Details Name: Cameron HidalgoWilliam P Maulding MRN: [id number] DOB: 10-05-1951 Today's Date: 02/07/2015   History of Present Illness  64 y.o. male [facility] with left knee infection s/p TKA polyexchange and I&D.  Pt with significant PMHx of L TKA on 12/31/14, HTN, DM, stroke, and back surgery.  Clinical Impression  Pt is POD #1 and is moving well, moderately antalgic gait pattern, but pain seems better than when he was admitted.  He will likely not need HHPT and can go straight to OP PT when MD deems appropriate at his f/u appointments.  Called MD/PA to clarify ROM vs no ROM at the knee.   PT to follow acutely for deficits listed below.       Follow Up Recommendations Outpatient PT (when MD deems appropriate)    Equipment Recommendations  None recommended by PT    Recommendations for Other Services   NA    Precautions / Restrictions Precautions Precautions: Knee Restrictions Weight Bearing Restrictions: Yes LLE Weight Bearing: Weight bearing as tolerated      Mobility  Bed Mobility Overal bed mobility: Needs Assistance Bed Mobility: Supine to Sit     Supine to sit: Min assist     General bed mobility comments: Min assist to support his left leg during transition to EOB.  Pt using bed rail for leverage to control movement of his trunk.   Transfers Overall transfer level: Needs assistance Equipment used: Rolling walker (2 wheeled) Transfers: Sit to/from Stand Sit to Stand: Min assist         General transfer comment: Min assist to support trunk during transitions.  Pt used safe hand placement.   Ambulation/Gait Ambulation/Gait assistance: Min guard Ambulation Distance (Feet): 100 Feet Assistive device: Rolling walker (2 wheeled) Gait Pattern/deviations: Step-through pattern;Antalgic;Trunk flexed     General Gait Details: Pt with moderately antalgic gait pattern.  Definitely needed to use RW today due to pain in left leg with  WB.  KI donned for gait. Pt needed verbal cues for upright posture.          Balance Overall balance assessment: Needs assistance Sitting-balance support: Feet supported;No upper extremity supported Sitting balance-Leahy Scale: Good     Standing balance support: Bilateral upper extremity supported Standing balance-Leahy Scale: Poor                               Pertinent Vitals/Pain Pain Assessment: 0-10 Pain Score: 6  Pain Location: left knee Pain Descriptors / Indicators: Aching;Burning Pain Intervention(s): Limited activity within patient's tolerance;Monitored during session;Repositioned    Home Living Family/patient expects to be discharged to:: Private residence Living Arrangements: Spouse/significant other Available Help at Discharge: Family;Available 24 hours/day Type of Home: House Home Access: Level entry     Home Layout: One level Home Equipment: Walker - 4 wheels;Shower seat      Prior Function Level of Independence: Independent         Comments: Pt had finished his OP PT and was walking without an assistive device.         Extremity/Trunk Assessment   Upper Extremity Assessment: Overall WFL for tasks assessed           Lower Extremity Assessment: LLE deficits/detail   LLE Deficits / Details: left leg with normal post op pain and weakness, ankle 3/5, knee limited testing due to no confirmation of ROM restricitions, but grossly 2/5, hip 2/5 flexion  Cervical /  Trunk Assessment: Other exceptions  Communication   Communication: No difficulties  Cognition Arousal/Alertness: Awake/alert Behavior During Therapy: WFL for tasks assessed/performed Overall Cognitive Status: Within Functional Limits for tasks assessed                               Assessment/Plan    PT Assessment Patient needs continued PT services  PT Diagnosis Difficulty walking;Abnormality of gait;Generalized weakness;Acute pain   PT Problem List  Decreased strength;Decreased range of motion;Decreased activity tolerance;Decreased balance;Decreased mobility;Decreased knowledge of use of DME;Decreased knowledge of precautions;Pain  PT Treatment Interventions DME instruction;Stair training;Gait training;Functional mobility training;Therapeutic activities;Therapeutic exercise;Balance training;Neuromuscular re-education;Patient/family education;Manual techniques;Modalities   PT Goals (Current goals can be found in the Care Plan section) Acute Rehab PT Goals Patient Stated Goal: to go home tomorrow PT Goal Formulation: With patient Time For Goal Achievement: 02/14/15 Potential to Achieve Goals: Good    Frequency 7X/week    End of Session Equipment Utilized During Treatment: Left knee immobilizer Activity Tolerance: Patient limited by pain Patient left: in chair;with call bell/phone within reach;with family/visitor present           Time: 1152-1222 PT Time Calculation (min) (ACUTE ONLY): 30 min   Charges:   PT Evaluation $Initial PT Evaluation Tier I: 1 Procedure PT Treatments $Gait Training: 8-22 mins        Jalonda Antigua B. Sakiya Stepka, PT, DPT [phone]   02/07/2015, 3:27 PM

## 2015-02-07 NOTE — Consult Note (Signed)
[facility]    Date of Admission:  02/06/2015  Date of Consult:  02/07/2015  Reason for Consult: septic prosthetic total knee arthroplasty Referring Physician: Dr. Berenice Primas   HPI: Cameron [name] is an 64 y.o. male who underwent total knee arthroplasty by Dr. Berenice Primas on 12/31/2014. He had been doing well and was improved pain over the few first weeks postsurgically. He was dissipating in physical therapy vigorously. He did however notice that he was starting to have more pain up proximally 3 weeks ago with lying down which would be alleviated when he got up and rode his exercise bicycle. Yesterday morning around 5 AM he woke up abruptly with severe pain in his left knee and difficulty getting out of bed. He came to the emergency department were and aspirate for the knee disclosed 173,000 white blood cells with PMN predominance. There also gram-positive cocci in clusters and pairs and chains seen on Gram stain.  He was brought into the hospital and underwent surgery by Dr. Berenice Primas with:  IRRIGATION AND DEBRIDEMENT LEFT KNEE WITH POLY EXCHANGE (Left)  Rash and drop if cultures also been obtained. Patient is on vancomycin and cefazolin postoperatively and cultures are still intubating.   Past Medical History  Diagnosis Date  . Hypertension   . Diabetes mellitus without complication   . GERD (gastroesophageal reflux disease)   . H/O renal calculi     passed several on his own, also has had procedures for    . Arthritis     in the knees, hands & neck   . Stroke     "small stroke" 10/2013, states he had care at [facility]    Past Surgical History  Procedure Laterality Date  . Back surgery  1995    fusion- [facility]   . Appendectomy  2005  . Cholecystectomy    . Knee arthroscopy Left   . Total knee arthroplasty Left 12/31/2014    Procedure: TOTAL KNEE ARTHROPLASTY;  Surgeon: Alta Corning, MD;  Location: [facility];  Service:  Orthopedics;  Laterality: Left;  Left knee arthroplasty  ergies:   No Known Allergies   Medications: I have reviewed patients current medications as documented in Epic Anti-infectives    Start     Dose/Rate Route Frequency Ordered Stop   02/07/15 1400  vancomycin (VANCOCIN) IVPB 1000 mg/200 mL premix     1,000 mg 200 mL/hr over 60 Minutes Intravenous Every 12 hours 02/07/15 0843     02/07/15 0200  ceFAZolin (ANCEF) IVPB 2 g/50 mL premix     2 g 100 mL/hr over 30 Minutes Intravenous 3 times per day 02/07/15 0008 02/11/15 0559   02/07/15 0030  vancomycin (VANCOCIN) 1,750 mg in sodium chloride 0.9 % 500 mL IVPB     1,750 mg 250 mL/hr over 120 Minutes Intravenous  Once 02/07/15 0018 02/07/15 7829      Social History:  reports that he quit smoking about 41 years ago. He does not have any smokeless tobacco history on file. He reports that he does not drink alcohol or use illicit drugs.  No family history on file.  As in HPI and primary teams notes otherwise 12 point review of systems is negative  Blood pressure 120/64, pulse 101, temperature 98.4 F (36.9 C), temperature source Oral, resp. rate 14, height 6' (1.829 m), weight 187 lb (84.823 kg), SpO2 99 %. General: Alert and awake, oriented x3, not in any acute distress. HEENT: anicteric sclera,  EOMI, oropharynx clear and without exudate CVS regular rate, normal r,  no murmur rubs or gallops Chest: clear to auscultation bilaterally, no wheezing, rales or rhonchi Abdomen: soft nontender, nondistended, normal bowel sounds, Extremities: Left leg in brace Skin: no rashes Neuro: nonfocal, strength and sensation intact   Results for orders placed or performed during the hospital encounter of 02/06/15 (from the past 48 hour(s))  Glucose, capillary     Status: Abnormal   Collection Time: 02/06/15  4:56 PM  Result Value Ref Range   Glucose-Capillary 201 (H) 70 - 99 mg/dL  Anaerobic culture     Status: None (Preliminary result)    Collection Time: 02/06/15  6:19 PM  Result Value Ref Range   Specimen Description SYNOVIAL FLUID KNEE LEFT    Special Requests PATIENT ON FOLLOWING ROCEPHIN    Gram Stain      ABUNDANT WBC PRESENT,BOTH PMN AND MONONUCLEAR FEW GRAM POSITIVE COCCI IN PAIRS IN CLUSTERS Gram Stain Report Called to,Read Back By and Verified With: Gram Stain Report Called to,Read Back By and Verified With: DR GRAVES 1953 02/06/15 BY A BROWNING Performed at [facility] Performed at [place]; CULTURE IN PROGRESS FOR 5 DAYS Performed at [employer]    Report Status PENDING   Gram stain     Status: None   Collection Time: 02/06/15  6:19 PM  Result Value Ref Range   Specimen Description SYNOVIAL FLUID KNEE LEFT    Special Requests PATIENT ON FOLLOWING ROCEPHIN    Gram Stain      ABUNDANT WBC PRESENT,BOTH PMN AND MONONUCLEAR GRAM POSITIVE COCCI IN CLUSTERS IN PAIRS Gram Stain Report Called to,Read Back By and Verified With: DR GRAVES 1953 02/06/15 A BROWNING    Report Status 02/06/2015 FINAL   Body fluid culture     Status: None (Preliminary result)   Collection Time: 02/06/15  6:19 PM  Result Value Ref Range   Specimen Description SYNOVIAL FLUID KNEE LEFT    Special Requests PATIENT ON FOLLOWING ROCEPHIN    Gram Stain      ABUNDANT WBC PRESENT,BOTH PMN AND MONONUCLEAR FEW GRAM POSITIVE COCCI IN PAIRS IN CLUSTERS Gram Stain Report Called to,Read Back By and Verified With: Gram Stain Report Called to,Read Back By and Verified With: DR GRAVES 1953 02/06/15 BY A BROWNING Performed at [facility] Performed at [facility]    Culture PENDING    Report Status PENDING   Synovial cell count + diff, w/ crystals     Status: Abnormal   Collection Time: 02/06/15  6:19 PM  Result Value Ref Range   Color, Synovial PINK (A) YELLOW   Appearance-Synovial TURBID (A) CLEAR   Crystals, Fluid NO CRYSTALS SEEN    WBC, Synovial 244000 (H) 0 -  200 /cu mm    Comment: COUNT MAY BE INACCURATE DUE TO FIBRIN CLUMPS.   Neutrophil, Synovial 99 (H) 0 - 25 %    Comment: CELLULAR DEGENERATION NOTED   Lymphocytes-Synovial Fld 0 0 - 20 %   Monocyte-Macrophage-Synovial Fluid 1 (L) 50 - 90 %   Eosinophils-Synovial 0 0 - 1 %   Other Cells-SYN INTRACELLULAR BACTERIA NOTED   Anaerobic culture     Status: None (Preliminary result)   Collection Time: 02/06/15  6:26 PM  Result Value Ref Range   Specimen Description SYNOVIAL FLUID KNEE LEFT    Special Requests      PATIENT ON  FOLLOWING ROCEPHIN DEEP KNEE RECEIVED SWAB   Gram Stain      ABUNDANT WBC PRESENT,BOTH PMN AND MONONUCLEAR FEW GRAM POSITIVE COCCI IN PAIRS IN CLUSTERS Gram Stain Report Called to,Read Back By and Verified With: Gram Stain Report Called to,Read Back By and Verified With: DR GRAVES 1953 02/06/15 BY A BROWNING Performed at [facility] Performed at [facility]    Culture      NO ANAEROBES ISOLATED; CULTURE IN PROGRESS FOR 5 DAYS Performed at [facility]    Report Status PENDING   Gram stain     Status: None   Collection Time: 02/06/15  6:26 PM  Result Value Ref Range   Specimen Description SYNOVIAL KNEE FLUID KNEE LEFT    Special Requests      PATIENT ON FOLLOWING ROCEPHIN DEEP KNEE RECEIVED SWAB   Gram Stain      ABUNDANT WBC PRESENT,BOTH PMN AND MONONUCLEAR GRAM POSITIVE COCCI IN CHAINS IN CLUSTERS IN PAIRS Gram Stain Report Called to,Read Back By and Verified With: DR GRAVES 1953 02/06/15 A BROWNING    Report Status 02/06/2015 FINAL   Body fluid culture     Status: None (Preliminary result)   Collection Time: 02/06/15  6:26 PM  Result Value Ref Range   Specimen Description SYNOVIAL FLUID KNEE LEFT    Special Requests      PATIENT ON FOLLOWING ROCEPHIN DEEP KNEE RECEIVED SWAB   Gram Stain      ABUNDANT WBC PRESENT,BOTH PMN AND MONONUCLEAR FEW GRAM POSITIVE COCCI IN PAIRS IN CLUSTERS Gram Stain Report Called to,Read Back By and Verified  With: Gram Stain Report Called to,Read Back By and Verified With: DR GRAVES 1953 02/06/15 BY A BROWNING Performed at [facility] Performed at [facility]    Culture PENDING    Report Status PENDING   Glucose, capillary     Status: Abnormal   Collection Time: 02/06/15  8:04 PM  Result Value Ref Range   Glucose-Capillary 195 (H) 70 - 99 mg/dL  MRSA PCR Screening     Status: None   Collection Time: 02/07/15 12:22 AM  Result Value Ref Range   MRSA by PCR NEGATIVE NEGATIVE    Comment:        The GeneXpert MRSA Assay (FDA approved for NASAL specimens only), is one component of a comprehensive MRSA colonization surveillance program. It is not intended to diagnose MRSA infection nor to guide or monitor treatment for MRSA infections.   CBC     Status: Abnormal   Collection Time: 02/07/15  5:00 AM  Result Value Ref Range   WBC 15.2 (H) 4.0 - 10.5 K/uL   RBC 3.38 (L) 4.22 - 5.81 MIL/uL   Hemoglobin 10.2 (L) 13.0 - 17.0 g/dL   HCT 21.7 (L) 47.1 - 59.5 %   MCV 93.2 78.0 - 100.0 fL   MCH 30.2 26.0 - 34.0 pg   MCHC 32.4 30.0 - 36.0 g/dL   RDW 39.6 72.8 - 97.9 %   Platelets 160 150 - 400 K/uL  Basic metabolic panel     Status: Abnormal   Collection Time: 02/07/15  5:00 AM  Result Value Ref Range   Sodium 137 135 - 145 mmol/L   Potassium 3.9 3.5 - 5.1 mmol/L   Chloride 104 96 - 112 mmol/L   CO2 29 19 - 32 mmol/L   Glucose, Bld 191 (H) 70 - 99 mg/dL   BUN 10 6 - 23 mg/dL   Creatinine, Ser  0.98 0.50 - 1.35 mg/dL   Calcium 7.8 (L) 8.4 - 10.5 mg/dL   GFR calc non Af Amer 84 (L) >90 mL/min   GFR calc Af Amer >90 >90 mL/min    Comment: (NOTE) The eGFR has been calculated using the CKD EPI equation. This calculation has not been validated in all clinical situations. eGFR's persistently <90 mL/min signify possible Chronic Kidney Disease.    Anion gap 4 (L) 5 - 15  Glucose, capillary     Status: Abnormal   Collection Time: 02/07/15 11:16 AM  Result Value Ref Range    Glucose-Capillary 220 (H) 70 - 99 mg/dL   '@BRIEFLABTABLE'$ (sdes,specrequest,cult,reptstatus)   ) Recent Results (from the past 720 hour(s))  Anaerobic culture     Status: None (Preliminary result)   Collection Time: 02/06/15  6:19 PM  Result Value Ref Range Status   Specimen Description SYNOVIAL FLUID KNEE LEFT  Final   Special Requests PATIENT ON FOLLOWING ROCEPHIN  Final   Gram Stain   Final    ABUNDANT WBC PRESENT,BOTH PMN AND MONONUCLEAR FEW GRAM POSITIVE COCCI IN PAIRS IN CLUSTERS Gram Stain Report Called to,Read Back By and Verified With: Gram Stain Report Called to,Read Back By and Verified With: DR GRAVES 1953 02/06/15 BY A BROWNING Performed at [facility] Performed at [employer]    Culture   Final    NO ANAEROBES ISOLATED; CULTURE IN PROGRESS FOR 5 DAYS Performed at [facility]    Report Status PENDING  Incomplete  Gram stain     Status: None   Collection Time: 02/06/15  6:19 PM  Result Value Ref Range Status   Specimen Description SYNOVIAL FLUID KNEE LEFT  Final   Special Requests PATIENT ON FOLLOWING ROCEPHIN  Final   Gram Stain   Final    ABUNDANT WBC PRESENT,BOTH PMN AND MONONUCLEAR GRAM POSITIVE COCCI IN CLUSTERS IN PAIRS Gram Stain Report Called to,Read Back By and Verified With: DR GRAVES 1953 02/06/15 A BROWNING    Report Status 02/06/2015 FINAL  Final  Body fluid culture     Status: None (Preliminary result)   Collection Time: 02/06/15  6:19 PM  Result Value Ref Range Status   Specimen Description SYNOVIAL FLUID KNEE LEFT  Final   Special Requests PATIENT ON FOLLOWING ROCEPHIN  Final   Gram Stain   Final    ABUNDANT WBC PRESENT,BOTH PMN AND MONONUCLEAR FEW GRAM POSITIVE COCCI IN PAIRS IN CLUSTERS Gram Stain Report Called to,Read Back By and Verified With: Gram Stain Report Called to,Read Back By and Verified With: DR GRAVES 1953 02/06/15 BY A BROWNING Performed at [facility] Performed at [facility]    Culture  PENDING  Incomplete   Report Status PENDING  Incomplete  Anaerobic culture     Status: None (Preliminary result)   Collection Time: 02/06/15  6:26 PM  Result Value Ref Range Status   Specimen Description SYNOVIAL FLUID KNEE LEFT  Final   Special Requests   Final    PATIENT ON FOLLOWING ROCEPHIN DEEP KNEE RECEIVED SWAB   Gram Stain   Final    ABUNDANT WBC PRESENT,BOTH PMN AND MONONUCLEAR FEW GRAM POSITIVE COCCI IN PAIRS IN CLUSTERS Gram Stain Report Called to,Read Back By and Verified With: Gram Stain Report Called to,Read Back By and Verified With: DR GRAVES 1953 02/06/15 BY A BROWNING Performed at [facility] Performed at [facility]    Culture   Final    NO ANAEROBES ISOLATED;  CULTURE IN PROGRESS FOR 5 DAYS Performed at [facility]    Report Status PENDING  Incomplete  Gram stain     Status: None   Collection Time: 02/06/15  6:26 PM  Result Value Ref Range Status   Specimen Description SYNOVIAL KNEE FLUID KNEE LEFT  Final   Special Requests   Final    PATIENT ON FOLLOWING ROCEPHIN DEEP KNEE RECEIVED SWAB   Gram Stain   Final    ABUNDANT WBC PRESENT,BOTH PMN AND MONONUCLEAR GRAM POSITIVE COCCI IN CHAINS IN CLUSTERS IN PAIRS Gram Stain Report Called to,Read Back By and Verified With: DR GRAVES 1953 02/06/15 A BROWNING    Report Status 02/06/2015 FINAL  Final  Body fluid culture     Status: None (Preliminary result)   Collection Time: 02/06/15  6:26 PM  Result Value Ref Range Status   Specimen Description SYNOVIAL FLUID KNEE LEFT  Final   Special Requests   Final    PATIENT ON FOLLOWING ROCEPHIN DEEP KNEE RECEIVED SWAB   Gram Stain   Final    ABUNDANT WBC PRESENT,BOTH PMN AND MONONUCLEAR FEW GRAM POSITIVE COCCI IN PAIRS IN CLUSTERS Gram Stain Report Called to,Read Back By and Verified With: Gram Stain Report Called to,Read Back By and Verified With: DR GRAVES 1953 02/06/15 BY A BROWNING Performed at [facility] Performed at [facility]     Culture PENDING  Incomplete   Report Status PENDING  Incomplete  MRSA PCR Screening     Status: None   Collection Time: 02/07/15 12:22 AM  Result Value Ref Range Status   MRSA by PCR NEGATIVE NEGATIVE Final    Comment:        The GeneXpert MRSA Assay (FDA approved for NASAL specimens only), is one component of a comprehensive MRSA colonization surveillance program. It is not intended to diagnose MRSA infection nor to guide or monitor treatment for MRSA infections.      Impression/Recommendation  Principal Problem:   Infection of total left knee replacement Active Problems:   Diabetes   Infected prosthetic knee joint   Infection of total knee replacement   VITO BEG is a 64 y.o. 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  #1 septic prosthetic knee status post single step poly-exchange:  Continue vancomycin and cefazolin pending ID and sensitivity data.  We'll plan on treating him for 6 weeks with parenteral antibiotics followed by protracted oral antibiotics.  If he is growing staph coccus aureus and cultures I will likely add rifampin  Recheck post op esr and crp  #2 screening check HIV and viral hepatitis panel   02/07/2015, 2:20 PM   Thank you so much for this interesting consult  [place] [facility] [place] [phone] (pager) [phone] (office) 02/07/2015, 2:20 PM  [place] 02/07/2015, 2:20 PM

## 2015-02-07 NOTE — Progress Notes (Addendum)
02/07/2015 PT orders from today state, " no knee ROM yet" however pt and family report he was in CPM last night in PACU.  He also has orders for CPM in his chart.  PT called MD office and left a message for clarification.  Asked RN to hold CPM until clarification confirmed.   F/u @12 :30- PA called PT back and confirmed that gentle ROM was ok, and yes, for CPM use.  RN made aware.   Thanks,  Rollene Rotundaebecca B. Keymarion Bearman, PT, DPT [phone]

## 2015-02-07 NOTE — Progress Notes (Signed)
Physical Therapy Treatment Patient Details Name: Cameron HidalgoWilliam P Nesbit MRN: [id number] DOB: 04/25/1951 Today's Date: 02/07/2015    History of Present Illness 64 y.o. male [facility] with left knee infection s/p TKA polyexchange and I&D.  Pt with significant PMHx of L TKA on 12/31/14, HTN, DM, stroke, and back surgery.    PT Comments    PT came back this PM to review his HEP. Handout given and reviewed.  Discussed HHPT vs OP PT and pt is ok with going to OP PT.  I will need to emphasize the importance of doing his HEP 3 times a day at home.  PT will follow up in AM for more gait and exercise training.   Follow Up Recommendations  Outpatient PT (when Dr Luiz BlareGraves deems he is ready)     Equipment Recommendations  None recommended by PT    Recommendations for Other Services   NA     Precautions / Restrictions Precautions Precautions: Knee Precaution Booklet Issued: Yes (comment) Precaution Comments: exercise handout given and reviewed Required Braces or Orthoses: Knee Immobilizer - Left Restrictions LLE Weight Bearing: Weight bearing as tolerated    Mobility  Bed Mobility Overal bed mobility: Needs Assistance Bed Mobility: Supine to Sit     Supine to sit: Min assist     General bed mobility comments: Min assist to support his left leg during transition to EOB.  Pt using bed rail for leverage to control movement of his trunk.   Transfers Overall transfer level: Needs assistance Equipment used: Rolling walker (2 wheeled) Transfers: Sit to/from Stand Sit to Stand: Min assist         General transfer comment: Min assist to support trunk during transitions.  Pt used safe hand placement.   Ambulation/Gait Ambulation/Gait assistance: Min guard Ambulation Distance (Feet): 100 Feet Assistive device: Rolling walker (2 wheeled) Gait Pattern/deviations: Step-through pattern;Antalgic;Trunk flexed     General Gait Details: Pt with moderately antalgic gait pattern.   Definitely needed to use RW today due to pain in left leg with WB.  KI donned for gait. Pt needed verbal cues for upright posture.           Balance Overall balance assessment: Needs assistance Sitting-balance support: Feet supported;No upper extremity supported Sitting balance-Leahy Scale: Good     Standing balance support: Bilateral upper extremity supported Standing balance-Leahy Scale: Poor                      Cognition Arousal/Alertness: Awake/alert Behavior During Therapy: WFL for tasks assessed/performed Overall Cognitive Status: Within Functional Limits for tasks assessed                      Exercises Total Joint Exercises Ankle Circles/Pumps: AROM;Both;20 reps;Supine Quad Sets: AROM;Left;10 reps;Supine Short Arc Quad: AAROM;Left;10 reps;Supine Heel Slides: AAROM;Left;10 reps;Supine Hip ABduction/ADduction: AAROM;Left;10 reps;Supine Straight Leg Raises: AAROM;Left;10 reps;Supine Goniometric ROM: 12-20        Pertinent Vitals/Pain Pain Assessment: 0-10 Pain Score: 6  Pain Location: left knee Pain Descriptors / Indicators: Aching;Burning Pain Intervention(s): Limited activity within patient's tolerance;Monitored during session;Repositioned    Home Living Family/patient expects to be discharged to:: Private residence Living Arrangements: Spouse/significant other Available Help at Discharge: Family;Available 24 hours/day Type of Home: House Home Access: Level entry   Home Layout: One level Home Equipment: Walker - 4 wheels;Shower seat      Prior Function Level of Independence: Independent      Comments: Pt had  finished his OP PT and was walking without an assistive device.    PT Goals (current goals can now be found in the care plan section) Acute Rehab PT Goals Patient Stated Goal: to go home tomorrow PT Goal Formulation: With patient Time For Goal Achievement: 02/14/15 Potential to Achieve Goals: Good Progress towards PT goals:  Progressing toward goals    Frequency  7X/week    PT Plan Current plan remains appropriate       End of Session Equipment Utilized During Treatment: Left knee immobilizer Activity Tolerance: Patient limited by pain Patient left: in bed;with call bell/phone within reach;with family/visitor present     Time: 1610-9604 PT Time Calculation (min) (ACUTE ONLY): 15 min  Charges:  $Gait Training: 8-22 mins $Therapeutic Exercise: 8-22 mins                      Roark Rufo B. Gagandeep Kossman, PT, DPT [phone]   02/07/2015, 5:09 PM

## 2015-02-07 NOTE — Progress Notes (Signed)
Orthopedic Tech Progress Note Patient Details:  Cameron HidalgoWilliam P [name] 10-Oct-1951 [id number]      [name], [name] 02/07/2015, 12:39 AM

## 2015-02-08 ENCOUNTER — Encounter (HOSPITAL_COMMUNITY): Payer: Self-pay | Admitting: Orthopedic Surgery

## 2015-02-08 DIAGNOSIS — R7881 Bacteremia: Secondary | ICD-10-CM | POA: Insufficient documentation

## 2015-02-08 DIAGNOSIS — T8450XA Infection and inflammatory reaction due to unspecified internal joint prosthesis, initial encounter: Secondary | ICD-10-CM

## 2015-02-08 DIAGNOSIS — B951 Streptococcus, group B, as the cause of diseases classified elsewhere: Secondary | ICD-10-CM | POA: Insufficient documentation

## 2015-02-08 LAB — CBC
HCT: 29.1 % — ABNORMAL LOW (ref 39.0–52.0)
Hemoglobin: 9.3 g/dL — ABNORMAL LOW (ref 13.0–17.0)
MCH: 29.9 pg (ref 26.0–34.0)
MCHC: 32 g/dL (ref 30.0–36.0)
MCV: 93.6 fL (ref 78.0–100.0)
Platelets: 147 10*3/uL — ABNORMAL LOW (ref 150–400)
RBC: 3.11 MIL/uL — AB (ref 4.22–5.81)
RDW: 13.7 % (ref 11.5–15.5)
WBC: 10.8 10*3/uL — ABNORMAL HIGH (ref 4.0–10.5)

## 2015-02-08 LAB — GLUCOSE, CAPILLARY
GLUCOSE-CAPILLARY: 172 mg/dL — AB (ref 70–99)
GLUCOSE-CAPILLARY: 148 mg/dL — AB (ref 70–99)
Glucose-Capillary: 173 mg/dL — ABNORMAL HIGH (ref 70–99)
Glucose-Capillary: 120 mg/dL — ABNORMAL HIGH (ref 70–99)

## 2015-02-08 LAB — HEPATITIS PANEL, ACUTE
HCV AB: NEGATIVE
Hep A IgM: NONREACTIVE
Hep B C IgM: NONREACTIVE
Hepatitis B Surface Ag: NEGATIVE

## 2015-02-08 LAB — SEDIMENTATION RATE: Sed Rate: 66 mm/hr — ABNORMAL HIGH (ref 0–16)

## 2015-02-08 LAB — HEMOGLOBIN A1C
Hgb A1c MFr Bld: 7.7 % — ABNORMAL HIGH (ref 4.8–5.6)
Mean Plasma Glucose: 174 mg/dL

## 2015-02-08 LAB — C-REACTIVE PROTEIN: CRP: 27 mg/dL — ABNORMAL HIGH (ref <0.60)

## 2015-02-08 MED ORDER — CEFTRIAXONE SODIUM IN DEXTROSE 40 MG/ML IV SOLN
2.0000 g | INTRAVENOUS | Status: DC
  Administered 2015-02-08 – 2015-02-12 (×5): 2 g via INTRAVENOUS
  Filled 2015-02-08 (×5): qty 50

## 2015-02-08 NOTE — Op Note (Signed)
NAME:  Cameron [name], Cameron [name]            ACCOUNT NO.:  [id number]  MEDICAL RECORD NO.:  [id number]  LOCATION:                                 FACILITY:  PHYSICIAN:  Harvie JuniorJohn L. Esmee Fallaw, M.D.   DATE OF BIRTH:  01-29-1951  DATE OF PROCEDURE:  02/06/2015 DATE OF DISCHARGE:                              OPERATIVE REPORT   PREOPERATIVE DIAGNOSIS:  Acutely infected left total knee replacement approximately 5-6 weeks out from surgery.  POSTOPERATIVE DIAGNOSIS:  Acutely infected left total knee replacement approximately 5-6 weeks out from surgery.  PRINCIPAL PROCEDURE: 1. Extensive synovectomy superior, medial, lateral, anterior and     posterior. 2. Total knee revision of single component by way of polyethylene     swab. 3. Lateral retinacular release.  SURGEON:  Harvie JuniorJohn L. Lain Tetterton, M.D.  ASSISTANT:  Marshia LyJames Bethune, PA-C  ANESTHESIA:  General.  BRIEF HISTORY:  Cameron [name] is a 64 year old male with a history of having had a left total knee replacement done approximately 6 weeks ago. He was doing well up until literally this morning when he awoken about 5 a.m. with significant pain in the left knee.  He had gone to therapy the day before and was doing well.  His pain was less than it had been in his entire postoperative course and he felt quite comfortable until he awoke with significant pain.  The pain worsened and he ultimately presented to the emergency room because of inability to stand and move the knee.  He was afebrile.  His white count was 18,000 and at that point, we aspirated the knee and he had a 170,000 white cells and gram- positive cocci.  [facility] and I took emergently to the operating room.  At that time, he had become febrile to 101.3.  He was tachycardic at about 117 and he did not look good and I was concerned that he was becoming septic.  He was taken directly to the operating room for the following procedure.  DESCRIPTION OF  PROCEDURE:  The patient was taken to the operating room. After adequate anesthesia was obtained by general surgery, the patient was placed supine on the operating table.  The left leg was prepped and draped in usual sterile fashion.  Following this, the knee was aspirated and about 60 mL of pus was drawn of the knee and sent to the lab for Gram stain and culture.  We then exposed the knee and began tediously working through an extensive synovectomy medial, lateral, superior, anterior, and into the posterior aspect of the knee as well.  Once this extensive synovectomy was done, we used Clorpactin into the knee. First, we took the poly out to get to the back of the knee well and dissected around medially so we could expose the whole knee.  We did put Clorpactin and for 2 minutes we tried to place any kind of bacterial adhesion to the metal.  We then took that out.  We did 3 L of normal saline irrigation.  We took Betadine peroxide mixture for 3 minutes and then irrigated the second 3 L of normal saline irrigation, put the tourniquet down and controlled all  bleeding with electrocautery, put a poly trial back in.  We had a 10.  There was little bit of lateral tracking of the patella that was unhappy with, so I did a lateral retinacular release to get the patellar tracking centralized and then released some underneath the skin just to get some adhesions away so we could try to get a better motion for the patient.  At this point, we irrigated the wound thoroughly and closed with 2-0 Vicryl and staples. Sterile compressive dressing was applied and the patient was taken to the recovery room, where he was noted to be in satisfactory condition. Estimated blood loss for the procedure was minimal.     Harvie Junior, M.D.     Ranae Plumber  D:  02/06/2015  T:  02/07/2015  Job:  [id number]

## 2015-02-08 NOTE — Progress Notes (Addendum)
Subjective: 2 Days Post-Op Procedure(s) (LRB): IRRIGATION AND DEBRIDEMENT LEFT KNEE WITH POLY EXCHANGE (Left) Patient reports pain as moderate. Taking by mouth and voiding okay   Objective: Vital signs in last 24 hours: Temp:  [98.5 F (36.9 C)-103 F (39.4 C)] 98.5 F (36.9 C) (02/12 0614) Pulse Rate:  [97-135] 97 (02/12 0614) Resp:  [15-18] 18 (02/12 0614) BP: (118-133)/(61-73) 123/65 mmHg (02/12 0614) SpO2:  [90 %-99 %] 97 % (02/12 0614)  Intake/Output from previous day: 02/11 0701 - 02/12 0700 In: 835 [P.O.:360; IV Piggyback:250] Out: 1475 [Urine:1000; Drains:475] Intake/Output this shift: Total I/O In: 240 [P.O.:240] Out: 50 [Drains:50]   Recent Labs  02/07/15 0500 02/08/15 0630  HGB 10.2* 9.3*    Recent Labs  02/07/15 0500 02/08/15 0630  WBC 15.2* 10.8*  RBC 3.38* 3.11*  HCT 31.5* 29.1*  PLT 160 147*    Recent Labs  02/07/15 0500  NA 137  K 3.9  CL 104  CO2 29  BUN 10  CREATININE 0.98  GLUCOSE 191*  CALCIUM 7.8*   cultures grew group B strep No results for input(s): LABPT, INR in the last 72 hours. Left knee exam: Dressing clean and dry. 2 large-bore Hemovac drains are in place. Calf is soft and nontender. And V intact to the foot. Distal pulses 2+. Mild swelling in his foot and lower leg.   Assessment/Plan: 2 Days Post-Op Procedure(s) (LRB): IRRIGATION AND DEBRIDEMENT LEFT KNEE WITH POLY EXCHANGE (Left) for septic arthritis left knee status post total knee replacement Diabetes mellitus. Plan: Continue careful diabetes management. On sliding scale insulin. Continue IV vancomycin and cefazolin until ID recommends long-term IV antibiotics. Continue drain. Will order a PICC line for tomorrow. We will consider discharge home tomorrow afternoon. Continue oral aspirin 325 mg twice daily with SCDs for DVT prophylaxis. We appreciate infectious disease's assistance in this case  Cameron [name] G 02/08/2015, 1:55 PM

## 2015-02-08 NOTE — Progress Notes (Signed)
[facility]    Subjective: No new complaints   Antibiotics:  Anti-infectives    Start     Dose/Rate Route Frequency Ordered Stop   02/08/15 1600  cefTRIAXone (ROCEPHIN) 2 g in dextrose 5 % 50 mL IVPB - Premix     2 g 100 mL/hr over 30 Minutes Intravenous Every 24 hours 02/08/15 1435     02/07/15 1400  vancomycin (VANCOCIN) IVPB 1000 mg/200 mL premix  Status:  Discontinued     1,000 mg 200 mL/hr over 60 Minutes Intravenous Every 12 hours 02/07/15 0843 02/08/15 1435   02/07/15 0200  ceFAZolin (ANCEF) IVPB 2 g/50 mL premix  Status:  Discontinued     2 g 100 mL/hr over 30 Minutes Intravenous 3 times per day 02/07/15 0008 02/08/15 1435   02/07/15 0030  vancomycin (VANCOCIN) 1,750 mg in sodium chloride 0.9 % 500 mL IVPB     1,750 mg 250 mL/hr over 120 Minutes Intravenous  Once 02/07/15 0018 02/07/15 6045      Medications: Scheduled Meds: . amLODipine  2.5 mg Oral QHS  . antiseptic oral rinse  7 mL Mouth Rinse BID  . aspirin EC  81 mg Oral Daily  . cefTRIAXone (ROCEPHIN)  IV  2 g Intravenous Q24H  . docusate sodium  100 mg Oral BID  . finasteride  5 mg Oral Daily  . glimepiride  4 mg Oral BID  . guaiFENesin  600 mg Oral BID  . insulin aspart  0-15 Units Subcutaneous TID WC  . linagliptin  5 mg Oral Daily  . metFORMIN  2,000 mg Oral Q supper  . pantoprazole  40 mg Oral Daily   Continuous Infusions: . sodium chloride 100 mL/hr at 02/08/15 0133   PRN Meds:.acetaminophen **OR** acetaminophen, alum & mag hydroxide-simeth, bisacodyl, diphenhydrAMINE, HYDROmorphone (DILAUDID) injection, ibuprofen, methocarbamol, ondansetron **OR** ondansetron (ZOFRAN) IV, oxyCODONE-acetaminophen, polyethylene glycol, promethazine    Objective: Weight change:   Intake/Output Summary (Last 24 hours) at 02/08/15 2020 Last data filed at 02/08/15 1300  Gross per 24 hour  Intake   1090 ml  Output    125 ml  Net    965 ml   Blood pressure 135/75, pulse 95,  temperature 98.2 F (36.8 C), temperature source Oral, resp. rate 18, height 6' (1.829 m), weight 187 lb (84.823 kg), SpO2 100 %. Temp:  [98.2 F (36.8 C)-102.1 F (38.9 C)] 98.2 F (36.8 C) (02/12 1408) Pulse Rate:  [95-120] 95 (02/12 1408) Resp:  [16-18] 18 (02/12 1408) BP: (118-135)/(65-75) 135/75 mmHg (02/12 1408) SpO2:  [94 %-100 %] 100 % (02/12 1408)  Physical Exam: General: Alert and awake, oriented x3, not in any acute distress. HEENT: anicteric sclera, EOMI, oropharynx clear and without exudate CVS regular rate, normal r, no murmur rubs or gallops Chest: clear to auscultation bilaterally, no wheezing, rales or rhonchi Abdomen: soft nontender, nondistended, normal bowel sounds, Extremities: Left leg in brace and in machine exercising it  Skin: no rashes Neuro: nonfocal, strength and sensation intact   CBC:  CBC Latest Ref Rng 02/08/2015 02/07/2015 01/01/2015  WBC 4.0 - 10.5 K/uL 10.8(H) 15.2(H) 10.2  Hemoglobin 13.0 - 17.0 g/dL 4.0(J) 10.2(L) 11.9(L)  Hematocrit 39.0 - 52.0 % 29.1(L) 31.5(L) 34.7(L)  Platelets 150 - 400 K/uL 147(L) 160 216       BMET  Recent Labs  02/07/15 0500  NA 137  K 3.9  CL 104  CO2 29  GLUCOSE 191*  BUN 10  CREATININE 0.98  CALCIUM 7.8*     Liver Panel  No results for input(s): PROT, ALBUMIN, AST, ALT, ALKPHOS, BILITOT, BILIDIR, IBILI in the last 72 hours.     Sedimentation Rate  Recent Labs  02/08/15 0630  ESRSEDRATE 66*   C-Reactive Protein  Recent Labs  02/08/15 0630  CRP 27.0*    Micro Results: Recent Results (from the past 720 hour(s))  Anaerobic culture     Status: None (Preliminary result)   Collection Time: 02/06/15  6:19 PM  Result Value Ref Range Status   Specimen Description SYNOVIAL FLUID KNEE LEFT  Final   Special Requests PATIENT ON FOLLOWING ROCEPHIN  Final   Gram Stain   Final    ABUNDANT WBC PRESENT,BOTH PMN AND MONONUCLEAR FEW GRAM POSITIVE COCCI IN PAIRS IN CLUSTERS Gram Stain Report  Called to,Read Back By and Verified With: Gram Stain Report Called to,Read Back By and Verified With: DR GRAVES 1953 02/06/15 BY A BROWNING [facility] Performed at [facility]    Culture   Final    NO ANAEROBES ISOLATED; CULTURE IN PROGRESS FOR 5 DAYS Performed at [facility]    Report Status PENDING  Incomplete  Gram stain     Status: None   Collection Time: 02/06/15  6:19 PM  Result Value Ref Range Status   Specimen Description SYNOVIAL FLUID KNEE LEFT  Final   Special Requests PATIENT ON FOLLOWING ROCEPHIN  Final   Gram Stain   Final    ABUNDANT WBC PRESENT,BOTH PMN AND MONONUCLEAR GRAM POSITIVE COCCI IN CLUSTERS IN PAIRS Gram Stain Report Called to,Read Back By and Verified With: DR GRAVES 1953 02/06/15 A BROWNING    Report Status 02/06/2015 FINAL  Final  Body fluid culture     Status: None (Preliminary result)   Collection Time: 02/06/15  6:19 PM  Result Value Ref Range Status   Specimen Description SYNOVIAL FLUID KNEE LEFT  Final   Special Requests PATIENT ON FOLLOWING ROCEPHIN  Final   Gram Stain   Final    ABUNDANT WBC PRESENT,BOTH PMN AND MONONUCLEAR FEW GRAM POSITIVE COCCI IN PAIRS IN CLUSTERS Gram Stain Report Called to,Read Back By and Verified With: Gram Stain Report Called to,Read Back By and Verified With: DR GRAVES 1953 02/06/15 BY A BROWNING Performed [facility] Performed at [facility]    Culture   Final    ABUNDANT GROUP B STREP(S.AGALACTIAE)ISOLATED Performed at [facility]    Report Status PENDING  Incomplete  Anaerobic culture     Status: None (Preliminary result)   Collection Time: 02/06/15  6:26 PM  Result Value Ref Range Status   Specimen Description SYNOVIAL FLUID KNEE LEFT  Final   Special Requests   Final    PATIENT ON FOLLOWING ROCEPHIN DEEP KNEE RECEIVED SWAB   Gram Stain   Final    ABUNDANT WBC PRESENT,BOTH PMN AND MONONUCLEAR FEW GRAM POSITIVE COCCI IN PAIRS IN CLUSTERS Gram  Stain Report Called to,Read Back By and Verified With: Gram Stain Report Called to,Read Back By and Verified With: DR GRAVES 1953 02/06/15 BY A BROWNING Performed at [facility] Performed at [facility]    Culture   Final    NO ANAEROBES ISOLATED; CULTURE IN PROGRESS FOR 5 DAYS Performed at [facility]    Report Status PENDING  Incomplete  Gram stain     Status: None   Collection Time: 02/06/15  6:26 PM  Result Value Ref Range Status  Specimen Description SYNOVIAL KNEE FLUID KNEE LEFT  Final   Special Requests   Final    PATIENT ON FOLLOWING ROCEPHIN DEEP KNEE RECEIVED SWAB   Gram Stain   Final    ABUNDANT WBC PRESENT,BOTH PMN AND MONONUCLEAR GRAM POSITIVE COCCI IN CHAINS IN CLUSTERS IN PAIRS Gram Stain Report Called to,Read Back By and Verified With: DR GRAVES 1953 02/06/15 A BROWNING    Report Status 02/06/2015 FINAL  Final  Body fluid culture     Status: None (Preliminary result)   Collection Time: 02/06/15  6:26 PM  Result Value Ref Range Status   Specimen Description SYNOVIAL FLUID KNEE LEFT  Final   Special Requests   Final    PATIENT ON FOLLOWING ROCEPHIN DEEP KNEE RECEIVED SWAB   Gram Stain   Final    ABUNDANT WBC PRESENT,BOTH PMN AND MONONUCLEAR FEW GRAM POSITIVE COCCI IN PAIRS IN CLUSTERS Gram Stain Report Called to,Read Back By and Verified With: Gram Stain Report Called to,Read Back By and Verified With: DR GRAVES 1953 02/06/15 BY A BROWNING Performed at [facility] Performed at [facility]    Culture   Final    ABUNDANT GROUP B STREP(S.AGALACTIAE)ISOLATED Performed at [facility]    Report Status PENDING  Incomplete  MRSA PCR Screening     Status: None   Collection Time: 02/07/15 12:22 AM  Result Value Ref Range Status   MRSA by PCR NEGATIVE NEGATIVE Final    Comment:        The GeneXpert MRSA Assay (FDA approved for NASAL specimens only), is one component of a comprehensive MRSA colonization surveillance  program. It is not intended to diagnose MRSA infection nor to guide or monitor treatment for MRSA infections.   Culture, blood (routine x 2)     Status: None (Preliminary result)   Collection Time: 02/08/15  3:25 PM  Result Value Ref Range Status   Specimen Description BLOOD LEFT ARM  Final   Special Requests BOTTLES DRAWN AEROBIC AND ANAEROBIC 10CC  Final   Culture PENDING  Incomplete   Report Status PENDING  Incomplete  Culture, blood (routine x 2)     Status: None (Preliminary result)   Collection Time: 02/08/15  3:40 PM  Result Value Ref Range Status   Specimen Description BLOOD LEFT HAND  Final   Special Requests BOTTLES DRAWN AEROBIC AND ANAEROBIC 10CC  Final   Culture PENDING  Incomplete   Report Status PENDING  Incomplete    Studies/Results: No results found.    Assessment/Plan:  Principal Problem:   Infection of total left knee replacement Active Problems:   Diabetes   Infected prosthetic knee joint   Infection of total knee replacement    Cameron [name] is a 64 y.o. 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   LOS: 2 days   Acey Lav 02/08/2015,  8:20 PM

## 2015-02-08 NOTE — Progress Notes (Signed)
PT Cancellation Note  Patient Details Name: Cameron [name] MRN: [id number] DOB: August 04, 1951   Cancelled Treatment:    Reason Eval/Treat Not Completed: Pain limiting ability to participate.  Pt reports that he just got out of the CPM and is now too sore to attempt walking at this time.  Wife notified RN of pain and requested pain meds.  PT to f/u tomorrow for continued gait and leg exercises.    Thanks,    Rollene Rotundaebecca B. Denetria Luevanos, PT, DPT [phone]   02/08/2015, 5:30 PM

## 2015-02-09 LAB — CBC
HCT: 29.4 % — ABNORMAL LOW (ref 39.0–52.0)
Hemoglobin: 9.4 g/dL — ABNORMAL LOW (ref 13.0–17.0)
MCH: 29.2 pg (ref 26.0–34.0)
MCHC: 32 g/dL (ref 30.0–36.0)
MCV: 91.3 fL (ref 78.0–100.0)
PLATELETS: 177 10*3/uL (ref 150–400)
RBC: 3.22 MIL/uL — ABNORMAL LOW (ref 4.22–5.81)
RDW: 13.4 % (ref 11.5–15.5)
WBC: 9.2 10*3/uL (ref 4.0–10.5)

## 2015-02-09 LAB — GLUCOSE, CAPILLARY
Glucose-Capillary: 153 mg/dL — ABNORMAL HIGH (ref 70–99)
Glucose-Capillary: 176 mg/dL — ABNORMAL HIGH (ref 70–99)
Glucose-Capillary: 152 mg/dL — ABNORMAL HIGH (ref 70–99)
Glucose-Capillary: 177 mg/dL — ABNORMAL HIGH (ref 70–99)

## 2015-02-09 NOTE — Progress Notes (Signed)
Subjective: 3 Days Post-Op Procedure(s) (LRB): IRRIGATION AND DEBRIDEMENT LEFT KNEE WITH POLY EXCHANGE (Left) Patient reports pain as mild.  The patient reports that he feels well.  Objective: Vital signs in last 24 hours: Temp:  [98.1 F (36.7 C)-102.1 F (38.9 C)] 98.5 F (36.9 C) (02/13 0454) Pulse Rate:  [93-95] 93 (02/13 0454) Resp:  [18] 18 (02/13 0454) BP: (129-135)/(68-77) 129/77 mmHg (02/13 0454) SpO2:  [95 %-100 %] 98 % (02/13 0454)  Intake/Output from previous day: 02/12 0701 - 02/13 0700 In: 980 [P.O.:930; IV Piggyback:50] Out: 1275 [Urine:1150; Drains:125] Intake/Output this shift:     Recent Labs  02/07/15 0500 02/08/15 0630 02/09/15 0529  HGB 10.2* 9.3* 9.4*    Recent Labs  02/08/15 0630 02/09/15 0529  WBC 10.8* 9.2  RBC 3.11* 3.22*  HCT 29.1* 29.4*  PLT 147* 177    Recent Labs  02/07/15 0500  NA 137  K 3.9  CL 104  CO2 29  BUN 10  CREATININE 0.98  GLUCOSE 191*  CALCIUM 7.8*   positive cultures left knee for group B strep Left knee exam: Dressing clean and dry. 2 large Hemovac drains are intact. Calf soft and nontender. Still has some moderate foot and ankle swelling.   Assessment/Plan: 3 Days Post-Op Procedure(s) (LRB): IRRIGATION AND DEBRIDEMENT LEFT KNEE WITH POLY EXCHANGE (Left) with septic left knee status post knee replacement and bacteremia Plan: Continue IV antibiotics per infectious disease. Will hold on PICC line until Monday. We'll keep drain in place. Continue to work on range of motion of the knee weightbearing as tolerated on the left. Continue CPM machine.   Nikya Busler G 02/09/2015, 8:40 AM

## 2015-02-09 NOTE — Progress Notes (Signed)
Physical Therapy Treatment Patient Details Name: Cameron [name] MRN: [id number] DOB: Apr 01, 1951 Today's Date: 02/09/2015    History of Present Illness 64 y.o. male admitted to [facility] on 02/06/15 with left knee infection s/p TKA polyexchange and I&D.  Pt with significant PMHx of L TKA on 12/31/14, HTN, DM, stroke, and back surgery.    PT Comments    Pt with improved ambulation tolerance this date as well at increased knee flexion in CPM up to 43 degrees. Con't to wear L KI for ambulation but suspect pt is strong enough to no longer require it however per orders KI to remain on during amb. Acute PT to con't to work with pt to progress mobility and ROM as able.  Follow Up Recommendations  Outpatient PT     Equipment Recommendations  None recommended by PT    Recommendations for Other Services       Precautions / Restrictions Precautions Precautions: Knee Precaution Booklet Issued: Yes (comment) Precaution Comments: exercise handout given and reviewed Required Braces or Orthoses: Knee Immobilizer - Left Knee Immobilizer - Left: On at all times Restrictions Weight Bearing Restrictions: Yes LLE Weight Bearing: Weight bearing as tolerated    Mobility  Bed Mobility Overal bed mobility: Needs Assistance Bed Mobility: Supine to Sit     Supine to sit: Min assist     General bed mobility comments: Min assist to support his left leg during transition to EOB.  Pt using bed rail for leverage to control movement of his trunk.   Transfers Overall transfer level: Needs assistance Equipment used: Rolling walker (2 wheeled) Transfers: Sit to/from Stand Sit to Stand: Min guard         General transfer comment: increased time, v/c's for hand placement  Ambulation/Gait Ambulation/Gait assistance: Min guard Ambulation Distance (Feet): 200 Feet Assistive device: Rolling walker (2 wheeled) Gait Pattern/deviations: Step-through pattern;Decreased stride length     General Gait  Details: pt with definite increase in UE WBing, KI on. no increase in pain during amb   Stairs            Wheelchair Mobility    Modified Rankin (Stroke Patients Only)       Balance Overall balance assessment: Needs assistance         Standing balance support: Bilateral upper extremity supported Standing balance-Leahy Scale: Poor                      Cognition Arousal/Alertness: Awake/alert Behavior During Therapy: WFL for tasks assessed/performed Overall Cognitive Status: Within Functional Limits for tasks assessed                      Exercises Total Joint Exercises Ankle Circles/Pumps: AROM;Both;20 reps;Supine Quad Sets: AROM;Left;10 reps;Supine Heel Slides: AAROM;Left;10 reps;Supine Goniometric ROM: 10-23    General Comments        Pertinent Vitals/Pain Pain Assessment: 0-10 Pain Score: 5  Pain Location: Left knee Pain Descriptors / Indicators: Sore Pain Intervention(s): Monitored during session    Home Living                      Prior Function            PT Goals (current goals can now be found in the care plan section) Acute Rehab PT Goals Patient Stated Goal: go home Progress towards PT goals: Progressing toward goals    Frequency  7X/week    PT Plan Current plan remains appropriate  Co-evaluation             End of Session Equipment Utilized During Treatment: Gait belt;Left knee immobilizer Activity Tolerance: Patient tolerated treatment well Patient left: in chair;with call bell/phone within reach     Time: 0730-0802 PT Time Calculation (min) (ACUTE ONLY): 32 min  Charges:  $Gait Training: 8-22 mins $Therapeutic Exercise: 8-22 mins                    G Codes:      Marcene BrawnChadwell, Xavier Munger Marie 02/09/2015, 10:28 AM  Lewis ShockAshly Sharnell Knight, PT, DPT Pager #: [phone] Office #: [phone]

## 2015-02-09 NOTE — Progress Notes (Signed)
[facility]    Subjective:   Feels better   Antibiotics:  Anti-infectives    Start     Dose/Rate Route Frequency Ordered Stop   02/08/15 1600  cefTRIAXone (ROCEPHIN) 2 g in dextrose 5 % 50 mL IVPB - Premix     2 g 100 mL/hr over 30 Minutes Intravenous Every 24 hours 02/08/15 1435     02/07/15 1400  vancomycin (VANCOCIN) IVPB 1000 mg/200 mL premix  Status:  Discontinued     1,000 mg 200 mL/hr over 60 Minutes Intravenous Every 12 hours 02/07/15 0843 02/08/15 1435   02/07/15 0200  ceFAZolin (ANCEF) IVPB 2 g/50 mL premix  Status:  Discontinued     2 g 100 mL/hr over 30 Minutes Intravenous 3 times per day 02/07/15 0008 02/08/15 1435   02/07/15 0030  vancomycin (VANCOCIN) 1,750 mg in sodium chloride 0.9 % 500 mL IVPB     1,750 mg 250 mL/hr over 120 Minutes Intravenous  Once 02/07/15 0018 02/07/15 [id number]      Medications: Scheduled Meds: . amLODipine  2.5 mg Oral QHS  . antiseptic oral rinse  7 mL Mouth Rinse BID  . aspirin EC  81 mg Oral Daily  . cefTRIAXone (ROCEPHIN)  IV  2 g Intravenous Q24H  . docusate sodium  100 mg Oral BID  . finasteride  5 mg Oral Daily  . glimepiride  4 mg Oral BID  . guaiFENesin  600 mg Oral BID  . insulin aspart  0-15 Units Subcutaneous TID WC  . linagliptin  5 mg Oral Daily  . metFORMIN  2,000 mg Oral Q supper  . pantoprazole  40 mg Oral Daily   Continuous Infusions: . sodium chloride 100 mL/hr at 02/08/15 0133   PRN Meds:.acetaminophen **OR** acetaminophen, alum & mag hydroxide-simeth, bisacodyl, diphenhydrAMINE, HYDROmorphone (DILAUDID) injection, ibuprofen, methocarbamol, ondansetron **OR** ondansetron (ZOFRAN) IV, oxyCODONE-acetaminophen, polyethylene glycol, promethazine    Objective: Weight change:   Intake/Output Summary (Last 24 hours) at 02/09/15 1850 Last data filed at 02/09/15 1738  Gross per 24 hour  Intake   1220 ml  Output   1425 ml  Net   -205 ml   Blood pressure 126/70, pulse 89, temperature  98.9 F (37.2 C), temperature source Oral, resp. rate 18, height 6' (1.829 m), weight 187 lb (84.823 kg), SpO2 100 %. Temp:  [98.1 F (36.7 C)-102.1 F (38.9 C)] 98.9 F (37.2 C) (02/13 1523) Pulse Rate:  [89-95] 89 (02/13 1523) Resp:  [18] 18 (02/13 1523) BP: (126-129)/(68-77) 126/70 mmHg (02/13 1523) SpO2:  [95 %-100 %] 100 % (02/13 1523)  Physical Exam: General: Alert and awake, oriented x3, not in any acute distress. HEENT: anicteric sclera, EOMI, oropharynx clear and without exudate CVS regular rate, normal r, no murmur rubs or gallops Chest: clear to auscultation bilaterally, no wheezing, rales or rhonchi Abdomen: soft nontender, nondistended, normal bowel sounds, Extremities: Left leg in brace  Skin: no rashes Neuro: nonfocal, strength and sensation intact   CBC:  CBC Latest Ref Rng 02/09/2015 02/08/2015 02/07/2015  WBC 4.0 - 10.5 K/uL 9.2 10.8(H) 15.2(H)  Hemoglobin 13.0 - 17.0 g/dL 9.6(E9.4(L) 4.5(W9.3(L) 10.2(L)  Hematocrit 39.0 - 52.0 % 29.4(L) 29.1(L) 31.5(L)  Platelets 150 - 400 K/uL 177 147(L) 160       BMET  Recent Labs  02/07/15 0500  NA 137  K 3.9  CL 104  CO2 29  GLUCOSE 191*  BUN 10  CREATININE 0.98  CALCIUM 7.8*  Liver Panel  No results for input(s): PROT, ALBUMIN, AST, ALT, ALKPHOS, BILITOT, BILIDIR, IBILI in the last 72 hours.     Sedimentation Rate  Recent Labs  02/08/15 0630  ESRSEDRATE 66*   C-Reactive Protein  Recent Labs  02/08/15 0630  CRP 27.0*    Micro Results: Recent Results (from the past 720 hour(s))  Anaerobic culture     Status: None (Preliminary result)   Collection Time: 02/06/15  6:19 PM  Result Value Ref Range Status   Specimen Description SYNOVIAL FLUID KNEE LEFT  Final   Special Requests PATIENT ON FOLLOWING ROCEPHIN  Final   Gram Stain   Final    ABUNDANT WBC PRESENT,BOTH PMN AND MONONUCLEAR FEW GRAM POSITIVE COCCI IN PAIRS IN CLUSTERS Gram Stain Report Called to,Read Back By and Verified With: Gram  Stain Report Called to,Read Back By and Verified With: DR GRAVES 1953 02/06/15 BY A BROWNING Performed at [facility] Performed at [facility]    Culture   Final    NO ANAEROBES ISOLATED; CULTURE IN PROGRESS FOR 5 DAYS Performed at [facility]    Report Status PENDING  Incomplete  Gram stain     Status: None   Collection Time: 02/06/15  6:19 PM  Result Value Ref Range Status   Specimen Description SYNOVIAL FLUID KNEE LEFT  Final   Special Requests PATIENT ON FOLLOWING ROCEPHIN  Final   Gram Stain   Final    ABUNDANT WBC PRESENT,BOTH PMN AND MONONUCLEAR GRAM POSITIVE COCCI IN CLUSTERS IN PAIRS Gram Stain Report Called to,Read Back By and Verified With: DR GRAVES 1953 02/06/15 A BROWNING    Report Status 02/06/2015 FINAL  Final  Body fluid culture     Status: None (Preliminary result)   Collection Time: 02/06/15  6:19 PM  Result Value Ref Range Status   Specimen Description SYNOVIAL FLUID KNEE LEFT  Final   Special Requests PATIENT ON FOLLOWING ROCEPHIN  Final   Gram Stain   Final    ABUNDANT WBC PRESENT,BOTH PMN AND MONONUCLEAR FEW GRAM POSITIVE COCCI IN PAIRS IN CLUSTERS Gram Stain Report Called to,Read Back By and Verified With: Gram Stain Report Called to,Read Back By and Verified With: DR GRAVES 1953 02/06/15 BY A BROWNING Performed at [facility] Performed at [facility]    Culture   Final    ABUNDANT GROUP B STREP(S.AGALACTIAE)ISOLATED Performed at [facility]    Report Status PENDING  Incomplete  Anaerobic culture     Status: None (Preliminary result)   Collection Time: 02/06/15  6:26 PM  Result Value Ref Range Status   Specimen Description SYNOVIAL FLUID KNEE LEFT  Final   Special Requests   Final    PATIENT ON FOLLOWING ROCEPHIN DEEP KNEE RECEIVED SWAB   Gram Stain   Final    ABUNDANT WBC PRESENT,BOTH PMN AND MONONUCLEAR FEW GRAM POSITIVE COCCI IN PAIRS IN CLUSTERS Gram Stain Report Called to,Read Back By and  Verified With: Gram Stain Report Called to,Read Back By and Verified With: DR GRAVES 1953 02/06/15 BY A BROWNING Performed at [facility] Performed at [facility]    Culture   Final    NO ANAEROBES ISOLATED; CULTURE IN PROGRESS FOR 5 DAYS Performed at [facility]    Report Status PENDING  Incomplete  Gram stain     Status: None   Collection Time: 02/06/15  6:26 PM  Result Value Ref Range Status   Specimen Description SYNOVIAL KNEE  FLUID KNEE LEFT  Final   Special Requests   Final    PATIENT ON FOLLOWING ROCEPHIN DEEP KNEE RECEIVED SWAB   Gram Stain   Final    ABUNDANT WBC PRESENT,BOTH PMN AND MONONUCLEAR GRAM POSITIVE COCCI IN CHAINS IN CLUSTERS IN PAIRS Gram Stain Report Called to,Read Back By and Verified With: DR GRAVES 1953 02/06/15 A BROWNING    Report Status 02/06/2015 FINAL  Final  Body fluid culture     Status: None (Preliminary result)   Collection Time: 02/06/15  6:26 PM  Result Value Ref Range Status   Specimen Description SYNOVIAL FLUID KNEE LEFT  Final   Special Requests   Final    PATIENT ON FOLLOWING ROCEPHIN DEEP KNEE RECEIVED SWAB   Gram Stain   Final    ABUNDANT WBC PRESENT,BOTH PMN AND MONONUCLEAR FEW GRAM POSITIVE COCCI IN PAIRS IN CLUSTERS Gram Stain Report Called to,Read Back By and Verified With: Gram Stain Report Called to,Read Back By and Verified With: DR GRAVES 1953 02/06/15 BY A BROWNING Performed at [facility] Performed at [facility]    Culture   Final    ABUNDANT GROUP B STREP(S.AGALACTIAE)ISOLATED Performed at [facility]    Report Status PENDING  Incomplete  MRSA PCR Screening     Status: None   Collection Time: 02/07/15 12:22 AM  Result Value Ref Range Status   MRSA by PCR NEGATIVE NEGATIVE Final    Comment:        The GeneXpert MRSA Assay (FDA approved for NASAL specimens only), is one component of a comprehensive MRSA colonization surveillance program. It is not intended to diagnose  MRSA infection nor to guide or monitor treatment for MRSA infections.   Culture, blood (routine x 2)     Status: None (Preliminary result)   Collection Time: 02/08/15  3:25 PM  Result Value Ref Range Status   Specimen Description BLOOD LEFT ARM  Final   Special Requests BOTTLES DRAWN AEROBIC AND ANAEROBIC 10CC  Final   Culture PENDING  Incomplete   Report Status PENDING  Incomplete  Culture, blood (routine x 2)     Status: None (Preliminary result)   Collection Time: 02/08/15  3:40 PM  Result Value Ref Range Status   Specimen Description BLOOD LEFT HAND  Final   Special Requests BOTTLES DRAWN AEROBIC AND ANAEROBIC 10CC  Final   Culture PENDING  Incomplete   Report Status PENDING  Incomplete    Studies/Results: No results found.    Assessment/Plan:  Principal Problem:   Infection of total left knee replacement Active Problems:   Diabetes   Infected prosthetic knee joint   Infection of total knee replacement   Bacteremia due to group B Streptococcus    Cameron [name] is a 64 y.o. 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  #1 Group B Streptococcus Septic knee with Bacteremia  I have become more acutely aware of virulence of this organism with respect to heart valves  --I would like 2D echo (ordered)  and I will ask Cardiology for TEE on Monday  --repeat blood cultures drawn yesterday  --continue Rocephin 2 grams daily  --DO NOT PLACE PICC until Monday   We'll plan on treating him for 6 weeks with parenteral antibiotics followed by protracted oral antibiotics, likely keflex   #2 screening check HIV and viral hepatitis panel is negative.   LOS: 3 days  Acey Lav 02/09/2015, 6:50 PM

## 2015-02-10 ENCOUNTER — Encounter (HOSPITAL_COMMUNITY): Payer: Self-pay | Admitting: Orthopedic Surgery

## 2015-02-10 LAB — GLUCOSE, CAPILLARY
GLUCOSE-CAPILLARY: 132 mg/dL — AB (ref 70–99)
GLUCOSE-CAPILLARY: 145 mg/dL — AB (ref 70–99)
Glucose-Capillary: 137 mg/dL — ABNORMAL HIGH (ref 70–99)
Glucose-Capillary: 179 mg/dL — ABNORMAL HIGH (ref 70–99)

## 2015-02-10 LAB — BODY FLUID CULTURE

## 2015-02-10 NOTE — Progress Notes (Signed)
Physical Therapy Treatment Patient Details Name: Cameron [name] MRN: [id number] DOB: 27-Dec-1951 Today's Date: 02/10/2015    History of Present Illness 64 y.o. [facility] with left knee infection s/p TKA polyexchange and I&D.  Pt with significant PMHx of L TKA on 12/31/14, HTN, DM, stroke, and back surgery.    PT Comments    Excellent progress with functional mobility and amb; Nice, stable knee in stance; on track fo rdc home tomorrow from a mobility standpoint  Follow Up Recommendations  Outpatient PT     Equipment Recommendations  None recommended by PT    Recommendations for Other Services       Precautions / Restrictions Precautions Precautions: Knee Precaution Booklet Issued: Yes (comment) Precaution Comments: exercise handout given and reviewed Required Braces or Orthoses: Knee Immobilizer - Left Knee Immobilizer - Left: On at all times (Nice, stable knee in standing) Restrictions Weight Bearing Restrictions: Yes LLE Weight Bearing: Weight bearing as tolerated    Mobility  Bed Mobility Overal bed mobility: Needs Assistance Bed Mobility: Supine to Sit     Supine to sit: Min guard     General bed mobility comments: Minguard assist, and help with getting out of CPM; no physical assist needed to come to stand Used RLE to assist LLE; used bed rails  Transfers Overall transfer level: Needs assistance Equipment used: Rolling walker (2 wheeled) Transfers: Sit to/from Stand Sit to Stand: Min guard (without physical contact)         General transfer comment: increased time, no need for VCs for hand placement; cues to self-monitor for L knee stability in stance  Ambulation/Gait Ambulation/Gait assistance: Supervision Ambulation Distance (Feet): 200 Feet Assistive device: Rolling walker (2 wheeled) Gait Pattern/deviations: Step-through pattern (emerging)     General Gait Details: Nice stable L knee instance, with no buckling, continued  incr weight bearing on RW   Stairs Stairs:  (pt reports no need for stair training)          Wheelchair Mobility    Modified Rankin (Stroke Patients Only)       Balance             Standing balance-Leahy Scale: Poor                      Cognition Arousal/Alertness: Awake/alert Behavior During Therapy: WFL for tasks assessed/performed Overall Cognitive Status: Within Functional Limits for tasks assessed  Became a little misty-eyed when talking about his family                    Exercises Total Joint Exercises Ankle Circles/Pumps: AROM;Both;20 reps;Supine Quad Sets: AROM;Left;10 reps;Supine Heel Slides: AAROM;Left;10 reps;Supine    General Comments        Pertinent Vitals/Pain Pain Assessment: 0-10 Pain Score: 2  Pain Location: L Knee Pain Descriptors / Indicators: Sore Pain Intervention(s): Monitored during session    Home Living                      Prior Function            PT Goals (current goals can now be found in the care plan section) Acute Rehab PT Goals Patient Stated Goal: go home PT Goal Formulation: With patient Time For Goal Achievement: 02/14/15 Potential to Achieve Goals: Good Progress towards PT goals: Progressing toward goals    Frequency  7X/week    PT Plan Current plan remains appropriate    Co-evaluation  End of Session Equipment Utilized During Treatment: Gait belt;Left knee immobilizer Activity Tolerance: Patient tolerated treatment well Patient left: prepping to wash up in bathroom; Nurse tech notified;with call bell/phone within reach     Time: 1610-9604 PT Time Calculation (min) (ACUTE ONLY): 26 min  Charges:  $Gait Training: 23-37 mins                    G Codes:      Olen Pel 02/10/2015, 9:20 AM  Van Clines, PT  Acute Rehabilitation Services Pager [phone] Office [phone]

## 2015-02-10 NOTE — Progress Notes (Signed)
Subjective: 4 Days Post-Op Procedure(s) (LRB): IRRIGATION AND DEBRIDEMENT LEFT KNEE WITH POLY EXCHANGE (Left) Patient reports pain as 2 on 0-10 scale.  The patient reports that he is feeling much better. He has been up ambulating in the hall with physical therapy. Objective: Vital signs in last 24 hours: Temp:  [98.1 F (36.7 C)-98.9 F (37.2 C)] 98.1 F (36.7 C) (02/14 0603) Pulse Rate:  [88-91] 88 (02/14 0603) Resp:  [18] 18 (02/14 0603) BP: (126-148)/(66-75) 148/75 mmHg (02/14 0603) SpO2:  [99 %-100 %] 100 % (02/14 0603)  Intake/Output from previous day: 02/13 0701 - 02/14 0700 In: 1190 [P.O.:1140; IV Piggyback:50] Out: 600 [Urine:600] Intake/Output this shift: Total I/O In: 240 [P.O.:240] Out: 300 [Urine:300]   Recent Labs  02/08/15 0630 02/09/15 0529  HGB 9.3* 9.4*    Recent Labs  02/08/15 0630 02/09/15 0529  WBC 10.8* 9.2  RBC 3.11* 3.22*  HCT 29.1* 29.4*  PLT 147* 177   left knee synovial fluid cultures positive for group B strep. Left knee exam: Hemovac drains intact. Minimal output in last 12 hours. Wound benign. No recurrent fluid accumulation. Calf soft and nontender. Still has some mild foot swelling. No wound redness.   Assessment/Plan: 4 Days Post-Op Procedure(s) (LRB): IRRIGATION AND DEBRIDEMENT LEFT KNEE WITH POLY EXCHANGE (Left) with septic left knee status post knee replacement. Bacteremia. Plan: Hemovac drains pulled. Dressing changed. Continue to encourage knee range of motion. Continue CPM. Continue IV antibiotics per infectious disease. Hopefully can get PICC line tomorrow. TEE tomorrow. Dr. Algis LimingVandam has managed this. Continue aspirin and SCDs for DVT prophylaxis as well as mobilization.  Cameron [name] 02/10/2015, 11:51 AM

## 2015-02-11 DIAGNOSIS — R7881 Bacteremia: Secondary | ICD-10-CM

## 2015-02-11 LAB — GLUCOSE, CAPILLARY
GLUCOSE-CAPILLARY: 205 mg/dL — AB (ref 70–99)
Glucose-Capillary: 167 mg/dL — ABNORMAL HIGH (ref 70–99)
Glucose-Capillary: 144 mg/dL — ABNORMAL HIGH (ref 70–99)
Glucose-Capillary: 86 mg/dL (ref 70–99)

## 2015-02-11 LAB — ANAEROBIC CULTURE

## 2015-02-11 LAB — HIV ANTIBODY (ROUTINE TESTING W REFLEX): HIV Screen 4th Generation wRfx: NONREACTIVE

## 2015-02-11 MED ORDER — PNEUMOCOCCAL VAC POLYVALENT 25 MCG/0.5ML IJ INJ: 0.5000 mL | INJECTION | INTRAMUSCULAR | Status: DC

## 2015-02-11 NOTE — Progress Notes (Signed)
Subjective: 5 Days Post-Op Procedure(s) (LRB): IRRIGATION AND DEBRIDEMENT LEFT KNEE WITH POLY EXCHANGE (Left) Patient reports pain as mild. Scheduled for a 2-D echo this morning and possible TEE   Objective: Vital signs in last 24 hours: Temp:  [98 F (36.7 C)] 98 F (36.7 C) (02/15 0554) Pulse Rate:  [84] 84 (02/15 0554) Resp:  [18] 18 (02/15 0554) BP: (136)/(80) 136/80 mmHg (02/15 0554) SpO2:  [98 %] 98 % (02/15 0554)  Intake/Output from previous day: 02/14 0701 - 02/15 0700 In: 720 [P.O.:720] Out: 300 [Urine:300] Intake/Output this shift:     Recent Labs  02/09/15 0529  HGB 9.4*    Recent Labs  02/09/15 0529  WBC 9.2  RBC 3.22*  HCT 29.4*  PLT 177   Left knee exam: Dressing clean and dry.  Calf soft and nontender.  Neurovascular status intact distally.  Moves foot actively.  Using CPM machine.   Assessment/Plan: 5 Days Post-Op Procedure(s) (LRB): IRRIGATION AND DEBRIDEMENT LEFT KNEE WITH POLY EXCHANGE (Left)with septic left knee status post knee replacement and bacteremia. Plan: Continue further workup by ID. Continue IV antibiotics. Will be ready for discharge home when ID has completed full workup.   Anely Spiewak G 02/11/2015, 10:14 AM

## 2015-02-11 NOTE — Progress Notes (Signed)
  Echocardiogram 2D Echocardiogram has been performed.  Leta JunglingCooper, Karin Griffith M 02/11/2015, 10:05 AM

## 2015-02-11 NOTE — Progress Notes (Signed)
Physical Therapy Treatment Patient Details Name: Cameron [name] MRN: [name] DOB: 1951-05-01 Today's Date: 02/11/2015    History of Present Illness 64 y.o. [facility] with left knee infection s/p TKA polyexchange and I&D.  Pt with significant PMHx of L TKA on 12/31/14, HTN, DM, stroke, and back surgery.    PT Comments    Continuing progress with functional mobility; Able to safely walk the hallways with family assist; noted for TEE tomorrow; On track for dc home tomorrow from a functional mobility standpoint;  Will plan to focus next session on therex.   Follow Up Recommendations  Outpatient PT     Equipment Recommendations  None recommended by PT    Recommendations for Other Services       Precautions / Restrictions Precautions Precautions: Knee Restrictions LLE Weight Bearing: Weight bearing as tolerated    Mobility  Bed Mobility                  Transfers Overall transfer level: Needs assistance Equipment used: Rolling walker (2 wheeled) Transfers: Sit to/from Stand Sit to Stand: Supervision         General transfer comment: increased time, no need for VCs for hand placement; cues to self-monitor for L knee stability in stance  Ambulation/Gait Ambulation/Gait assistance: Supervision Ambulation Distance (Feet): 300 Feet Assistive device: Rolling walker (2 wheeled) Gait Pattern/deviations: Step-through pattern;Antalgic;Decreased stride length;Decreased stance time - left;Decreased step length - right (emerging step-through) Gait velocity: decr, but improving   General Gait Details: Nice stable L knee instance, with no buckling, continued incr weight bearing on RW   Stairs Stairs: Yes Stairs assistance: Supervision Stair Management: One rail Left Number of Stairs: 5 General stair comments: Overall managing well, noted incr pain with stair negotiation  Wheelchair Mobility    Modified Rankin (Stroke Patients Only)        Balance     Sitting balance-Leahy Scale: Good       Standing balance-Leahy Scale: Fair                      Cognition Arousal/Alertness: Awake/alert Behavior During Therapy: WFL for tasks assessed/performed Overall Cognitive Status: Within Functional Limits for tasks assessed                      Exercises      General Comments General comments (skin integrity, edema, etc.): Pt seems very happy with the fact that he could increase his flexion angle on CPM to 71 deg; discussed gentle ROM and therex, provided pt with handout for TKA therex which he assures me he will perform throughout the day      Pertinent Vitals/Pain Pain Assessment: 0-10 Pain Score: 6  Pain Location: Left knee end of amb Pain Descriptors / Indicators: Aching Pain Intervention(s): Monitored during session    Home Living                      Prior Function            PT Goals (current goals can now be found in the care plan section) Acute Rehab PT Goals Patient Stated Goal: go home PT Goal Formulation: With patient Time For Goal Achievement: 02/14/15 Potential to Achieve Goals: Good Progress towards PT goals: Progressing toward goals    Frequency  7X/week    PT Plan Current plan remains appropriate    Co-evaluation  End of Session   Activity Tolerance: Patient tolerated treatment well Patient left: Other (comment) (walking in hallway with RW and wife safely)     Time: 1610-9604 PT Time Calculation (min) (ACUTE ONLY): 21 min  Charges:  $Gait Training: 8-22 mins                    G Codes:      Olen Pel 02/11/2015, 10:13 AM  Van Clines, PT  Acute Rehabilitation Services Pager [phone] Office [phone]

## 2015-02-11 NOTE — Progress Notes (Signed)
    [facility]   SPOKE WITH CARDIOLOGY THEY CAN DO TEE TOMORROW CANNOT ADD ON ANOTHER PT TODAY  SHOULD MAKE NPO AT MIDNIGHT TONIGHT

## 2015-02-11 NOTE — Progress Notes (Signed)
[facility]    Subjective:   Feels better   Antibiotics:  Anti-infectives    Start     Dose/Rate Route Frequency Ordered Stop   02/08/15 1600  cefTRIAXone (ROCEPHIN) 2 g in dextrose 5 % 50 mL IVPB - Premix     2 g 100 mL/hr over 30 Minutes Intravenous Every 24 hours 02/08/15 1435     02/07/15 1400  vancomycin (VANCOCIN) IVPB 1000 mg/200 mL premix  Status:  Discontinued     1,000 mg 200 mL/hr over 60 Minutes Intravenous Every 12 hours 02/07/15 0843 02/08/15 1435   02/07/15 0200  ceFAZolin (ANCEF) IVPB 2 g/50 mL premix  Status:  Discontinued     2 g 100 mL/hr over 30 Minutes Intravenous 3 times per day 02/07/15 0008 02/08/15 1435   02/07/15 0030  vancomycin (VANCOCIN) 1,750 mg in sodium chloride 0.9 % 500 mL IVPB     1,750 mg 250 mL/hr over 120 Minutes Intravenous  Once 02/07/15 0018 02/07/15 9147      Medications: Scheduled Meds: . amLODipine  2.5 mg Oral QHS  . antiseptic oral rinse  7 mL Mouth Rinse BID  . aspirin EC  81 mg Oral Daily  . cefTRIAXone (ROCEPHIN)  IV  2 g Intravenous Q24H  . docusate sodium  100 mg Oral BID  . finasteride  5 mg Oral Daily  . glimepiride  4 mg Oral BID  . guaiFENesin  600 mg Oral BID  . insulin aspart  0-15 Units Subcutaneous TID WC  . linagliptin  5 mg Oral Daily  . metFORMIN  2,000 mg Oral Q supper  . pantoprazole  40 mg Oral Daily  . [START ON 02/12/2015] pneumococcal 23 valent vaccine  0.5 mL Intramuscular Tomorrow-1000   Continuous Infusions: . sodium chloride 100 mL/hr at 02/08/15 0133   PRN Meds:.acetaminophen **OR** acetaminophen, alum & mag hydroxide-simeth, bisacodyl, diphenhydrAMINE, HYDROmorphone (DILAUDID) injection, ibuprofen, methocarbamol, ondansetron **OR** ondansetron (ZOFRAN) IV, oxyCODONE-acetaminophen, polyethylene glycol, promethazine    Objective: Weight change:   Intake/Output Summary (Last 24 hours) at 02/11/15 1414 Last data filed at 02/10/15 1700  Gross per 24 hour  Intake     480 ml  Output      0 ml  Net    480 ml   Blood pressure 159/80, pulse 89, temperature 98.3 F (36.8 C), temperature source Oral, resp. rate 18, height 6' (1.829 m), weight 187 lb (84.823 kg), SpO2 100 %. Temp:  [98 F (36.7 C)-98.3 F (36.8 C)] 98.3 F (36.8 C) (02/15 1032) Pulse Rate:  [84-89] 89 (02/15 1032) Resp:  [18] 18 (02/15 1032) BP: (136-159)/(80) 159/80 mmHg (02/15 1032) SpO2:  [98 %-100 %] 100 % (02/15 1032)  Physical Exam: General: Alert and awake, oriented x3, not in any acute distress. HEENT: anicteric sclera, EOMI, oropharynx clear and without exudate CVS regular rate, normal r, no murmur rubs or gallops Chest: clear to auscultation bilaterally, no wheezing, rales or rhonchi Abdomen: soft nontender, nondistended, normal bowel sounds, Extremities: Left leg in brace  Skin: no rashes Neuro: nonfocal, strength and sensation intact   CBC:  CBC Latest Ref Rng 02/09/2015 02/08/2015 02/07/2015  WBC 4.0 - 10.5 K/uL 9.2 10.8(H) 15.2(H)  Hemoglobin 13.0 - 17.0 g/dL 8.2(N) 5.6(O) 10.2(L)  Hematocrit 39.0 - 52.0 % 29.4(L) 29.1(L) 31.5(L)  Platelets 150 - 400 K/uL 177 147(L) 160       BMET No results for input(s): NA, K, CL, CO2, GLUCOSE, BUN, CREATININE, CALCIUM in the last  72 hours.   Liver Panel  No results for input(s): PROT, ALBUMIN, AST, ALT, ALKPHOS, BILITOT, BILIDIR, IBILI in the last 72 hours.     Sedimentation Rate No results for input(s): ESRSEDRATE in the last 72 hours. C-Reactive Protein No results for input(s): CRP in the last 72 hours.  Micro Results: Recent Results (from the past 720 hour(s))  Anaerobic culture     Status: None   Collection Time: 02/06/15  6:19 PM  Result Value Ref Range Status   Specimen Description SYNOVIAL FLUID KNEE LEFT  Final   Special Requests PATIENT ON FOLLOWING ROCEPHIN  Final   Gram Stain   Final    ABUNDANT WBC PRESENT,BOTH PMN AND MONONUCLEAR FEW GRAM POSITIVE COCCI IN PAIRS IN CLUSTERS Gram Stain Report  Called to,Read Back By and Verified With: Gram Stain Report Called to,Read Back By and Verified With: DR GRAVES 1953 02/06/15 BY A BROWNING [facility] Performed at [facility]    Culture   Final    NO ANAEROBES ISOLATED [facility]formed at [facility]    Report Status 02/11/2015 FINAL  Final  Gram stain     Status: None   Collection Time: 02/06/15  6:19 PM  Result Value Ref Range Status   Specimen Description SYNOVIAL FLUID KNEE LEFT  Final   Special Requests PATIENT ON FOLLOWING ROCEPHIN  Final   Gram Stain   Final    ABUNDANT WBC PRESENT,BOTH PMN AND MONONUCLEAR GRAM POSITIVE COCCI IN CLUSTERS IN PAIRS Gram Stain Report Called to,Read Back By and Verified With: DR GRAVES 1953 02/06/15 A BROWNING    Report Status 02/06/2015 FINAL  Final  Body fluid culture     Status: None   Collection Time: 02/06/15  6:19 PM  Result Value Ref Range Status   Specimen Description SYNOVIAL FLUID KNEE LEFT  Final   Special Requests PATIENT ON FOLLOWING ROCEPHIN  Final   Gram Stain   Final    ABUNDANT WBC PRESENT,BOTH PMN AND MONONUCLEAR FEW GRAM POSITIVE COCCI IN PAIRS IN CLUSTERS Gram Stain Report Called to,Read Back By and Verified With: Gram Stain Report Called to,Read Back By and Verified With: DR GRAVES 1953 02/06/15 BY A BROWNING Performed [facility] Performed at [facility]    Culture   Final    ABUNDANT GROUP B STREP(S.AGALACTIAE)ISOLATED Performed at [facility]    Report Status 02/10/2015 FINAL  Final   Organism ID, Bacteria GROUP B STREP(S.AGALACTIAE)ISOLATED  Final      Susceptibility   Group b strep(s.agalactiae)isolated - MIC*    CLINDAMYCIN <=0.25 SENSITIVE Sensitive     ERYTHROMYCIN <=0.12 SENSITIVE Sensitive     LEVOFLOXACIN 1 SENSITIVE Sensitive     PENICILLIN <=0.06 SENSITIVE Sensitive     VANCOMYCIN 0.5 SENSITIVE Sensitive     AMPICILLIN <=0.25 SENSITIVE Sensitive     * ABUNDANT GROUP B  STREP(S.AGALACTIAE)ISOLATED  Anaerobic culture     Status: None   Collection Time: 02/06/15  6:26 PM  Result Value Ref Range Status   Specimen Description SYNOVIAL FLUID KNEE LEFT  Final   Special Requests   Final    PATIENT ON FOLLOWING ROCEPHIN DEEP KNEE RECEIVED SWAB   Gram Stain   Final    ABUNDANT WBC PRESENT,BOTH PMN AND MONONUCLEAR FEW GRAM POSITIVE COCCI IN PAIRS IN CLUSTERS Gram Stain Report Called to,Read Back By and Verified With: Gram Stain Report Called to,Read Back By and Verified With: DR GRAVES 1953 02/06/15 BY A BROWNING  Performed at [facility] Performed at [facility]    Culture   Final    NO ANAEROBES ISOLATED Performed at [facility]    Report Status 02/11/2015 FINAL  Final  Gram stain     Status: None   Collection Time: 02/06/15  6:26 PM  Result Value Ref Range Status   Specimen Description SYNOVIAL KNEE FLUID KNEE LEFT  Final   Special Requests   Final    PATIENT ON FOLLOWING ROCEPHIN DEEP KNEE RECEIVED SWAB   Gram Stain   Final    ABUNDANT WBC PRESENT,BOTH PMN AND MONONUCLEAR GRAM POSITIVE COCCI IN CHAINS IN CLUSTERS IN PAIRS Gram Stain Report Called to,Read Back By and Verified With: DR GRAVES 1953 02/06/15 A BROWNING    Report Status 02/06/2015 FINAL  Final  Body fluid culture     Status: None   Collection Time: 02/06/15  6:26 PM  Result Value Ref Range Status   Specimen Description SYNOVIAL FLUID KNEE LEFT  Final   Special Requests   Final    PATIENT ON FOLLOWING ROCEPHIN DEEP KNEE RECEIVED SWAB   Gram Stain   Final    ABUNDANT WBC PRESENT,BOTH PMN AND MONONUCLEAR FEW GRAM POSITIVE COCCI IN PAIRS IN CLUSTERS Gram Stain Report Called to,Read Back By and Verified With: Gram Stain Report Called to,Read Back By and Verified With: DR GRAVES 1953 02/06/15 BY A BROWNING Performed at [facility] Performed at [facility]    Culture   Final    ABUNDANT GROUP B STREP(S.AGALACTIAE)ISOLATED Note: SUSCEPTIBILITIES  PERFORMED ON PREVIOUS CULTURE WITHIN THE LAST 5 DAYS. Performed at [facility]    Report Status 02/10/2015 FINAL  Final  MRSA PCR Screening     Status: None   Collection Time: 02/07/15 12:22 AM  Result Value Ref Range Status   MRSA by PCR NEGATIVE NEGATIVE Final    Comment:        The GeneXpert MRSA Assay (FDA approved for NASAL specimens only), is one component of a comprehensive MRSA colonization surveillance program. It is not intended to diagnose MRSA infection nor to guide or monitor treatment for MRSA infections.   Culture, blood (routine x 2)     Status: None (Preliminary result)   Collection Time: 02/08/15  3:25 PM  Result Value Ref Range Status   Specimen Description BLOOD LEFT ARM  Final   Special Requests BOTTLES DRAWN AEROBIC AND ANAEROBIC 10CC  Final   Culture   Final           BLOOD CULTURE RECEIVED NO GROWTH TO DATE CULTURE WILL BE HELD FOR 5 DAYS BEFORE ISSUING A FINAL NEGATIVE REPORT Performed at [facility]    Report Status PENDING  Incomplete  Culture, blood (routine x 2)     Status: None (Preliminary result)   Collection Time: 02/08/15  3:40 PM  Result Value Ref Range Status   Specimen Description BLOOD LEFT HAND  Final   Special Requests BOTTLES DRAWN AEROBIC AND ANAEROBIC 10CC  Final   Culture   Final           BLOOD CULTURE RECEIVED NO GROWTH TO DATE CULTURE WILL BE HELD FOR 5 DAYS BEFORE ISSUING A FINAL NEGATIVE REPORT Performed at [facility]    Report Status PENDING  Incomplete    Studies/Results: No results found.    Assessment/Plan:  Principal Problem:   Infection of total left knee replacement Active Problems:   Diabetes   Infected prosthetic knee  joint   Infection of total knee replacement   Bacteremia due to group B Streptococcus    Cameron [name] is a 64 y.o. male with Septic total knee arthroplasty due to Group B streptococci and Group B streptococcal bacteremia sp post I&D and  poly-exchange.  #1 Group B Streptococcus Septic knee with Bacteremia  I have become more acutely aware of virulence of this organism with respect to heart valves  --I would like 2D echo  No vegetations  and I did ask Cardiology for TEE which they can do TOMORROW  --repeat blood cultures drawn  No growth  --continue Rocephin 2 grams daily  Ok to place PICC today or tomorrow   We'll plan on treating him for 6 weeks with parenteral antibiotics followed by protracted oral antibiotics, likely keflex  Dr.Campbell will  pick up the service tomorrow.   LOS: 5 days   Acey Lav 02/11/2015, 2:14 PM

## 2015-02-11 NOTE — Progress Notes (Signed)
    [facility] has been requested to perform a transesophageal echocardiogram on 02/11/2015 for Group B strep bacteremia.  After careful review of history and examination, the risks and benefits of transesophageal echocardiogram have been explained including risks of esophageal damage, perforation (1:10,000 risk), bleeding, pharyngeal hematoma as well as other potential complications associated with conscious sedation including aspiration, arrhythmia, respiratory failure and death. Alternatives to treatment were discussed, questions were answered. Patient is willing to proceed.   Azalee CourseMeng, Gracielynn Birkel, [place]PA-C 02/11/2015 4:19 PM

## 2015-02-11 NOTE — Progress Notes (Signed)
PT Cancellation Note  Patient Details Name: Cameron [name] MRN: [id number] DOB: 08/19/1951   Cancelled Treatment:    Reason Eval/Treat Not Completed: Pain limiting ability to participate   Attempted second PT session as we were unable to perform therex this am, however pt politely declined secondary to pain; RN notified;  Van ClinesHolly Dilon [name], PT  Acute Rehabilitation Services Pager [phone] Office [phone]'    Van ClinesGarrigan, Cameron Haris [facility] 02/11/2015, 3:18 PM

## 2015-02-12 ENCOUNTER — Encounter (HOSPITAL_COMMUNITY): Payer: Self-pay | Admitting: *Deleted

## 2015-02-12 ENCOUNTER — Encounter (HOSPITAL_COMMUNITY)
Admission: AD | Disposition: A | Discharge status: Home Health | Payer: Self-pay | Source: Other Acute Inpatient Hospital | Attending: Orthopedic Surgery

## 2015-02-12 DIAGNOSIS — B951 Streptococcus, group B, as the cause of diseases classified elsewhere: Secondary | ICD-10-CM

## 2015-02-12 DIAGNOSIS — Y838 Other surgical procedures as the cause of abnormal reaction of the patient, or of later complication, without mention of misadventure at the time of the procedure: Secondary | ICD-10-CM

## 2015-02-12 HISTORY — PX: TEE WITHOUT CARDIOVERSION: SHX5443

## 2015-02-12 LAB — GLUCOSE, CAPILLARY
Glucose-Capillary: 194 mg/dL — ABNORMAL HIGH (ref 70–99)
Glucose-Capillary: 124 mg/dL — ABNORMAL HIGH (ref 70–99)
Glucose-Capillary: 106 mg/dL — ABNORMAL HIGH (ref 70–99)
Glucose-Capillary: 234 mg/dL — ABNORMAL HIGH (ref 70–99)

## 2015-02-12 SURGERY — ECHOCARDIOGRAM, TRANSESOPHAGEAL
Anesthesia: Moderate Sedation

## 2015-02-12 MED ORDER — FENTANYL CITRATE 0.05 MG/ML IJ SOLN
INTRAMUSCULAR | Status: AC
  Filled 2015-02-12: qty 2

## 2015-02-12 MED ORDER — CEFTRIAXONE SODIUM IN DEXTROSE 40 MG/ML IV SOLN: 2.0000 g | INTRAVENOUS | Status: DC

## 2015-02-12 MED ORDER — MIDAZOLAM HCL 10 MG/2ML IJ SOLN
INTRAMUSCULAR | Status: DC | PRN
  Administered 2015-02-12 (×2): 2 mg via INTRAVENOUS
  Administered 2015-02-12: 1 mg via INTRAVENOUS

## 2015-02-12 MED ORDER — BUTAMBEN-TETRACAINE-BENZOCAINE 2-2-14 % EX AERO
INHALATION_SPRAY | CUTANEOUS | Status: DC | PRN
  Administered 2015-02-12: 2 via TOPICAL

## 2015-02-12 MED ORDER — DIPHENHYDRAMINE HCL 50 MG/ML IJ SOLN
INTRAMUSCULAR | Status: AC
  Filled 2015-02-12: qty 1

## 2015-02-12 MED ORDER — FENTANYL CITRATE 0.05 MG/ML IJ SOLN
INTRAMUSCULAR | Status: DC | PRN
  Administered 2015-02-12 (×3): 25 ug via INTRAVENOUS

## 2015-02-12 MED ORDER — SODIUM CHLORIDE 0.9 % IV SOLN
INTRAVENOUS | Status: DC
  Administered 2015-02-12: 500 mL via INTRAVENOUS

## 2015-02-12 MED ORDER — OXYCODONE-ACETAMINOPHEN 5-325 MG PO TABS: 1.0000 | ORAL_TABLET | ORAL | Status: DC | PRN

## 2015-02-12 MED ORDER — MIDAZOLAM HCL 5 MG/ML IJ SOLN
INTRAMUSCULAR | Status: AC
  Filled 2015-02-12: qty 2

## 2015-02-12 MED ORDER — SODIUM CHLORIDE 0.9 % IJ SOLN: 10.0000 mL | INTRAMUSCULAR | Status: DC | PRN

## 2015-02-12 NOTE — Progress Notes (Signed)
Patient d/c to home, IV removed, prescriptions given, instructions reviewed.  Patient unhappy with being sent home on Percocet, Gus PumaJim Bethune PA paged, no return call before d/c.  Patient insistent on IV Dilaudid shot before d/c.

## 2015-02-12 NOTE — Progress Notes (Signed)
PT Cancellation Note  Patient Details Name: Myna HidalgoWilliam P Si MRN: [id number] DOB: 05/24/1951   Cancelled Treatment:    Reason Eval/Treat Not Completed: Other (comment)   Pt politely, but emphatically declining therex or ambulation at this time;  We discussed the importance of therex for optimal healing/ROM, and pt understands;   I anticipate good progress; Agree with HHPT follow-up; Will follow while in house;  OK for dc today from a functional mobility standpoint.  Van ClinesHolly Reilley Latorre, [place]PT  Acute Rehabilitation Services Pager [phone] Office [phone]    Van ClinesGarrigan, Elfreida Heggs [facility] 02/12/2015, 10:32 AM

## 2015-02-12 NOTE — Progress Notes (Signed)
  Echocardiogram Echocardiogram Transesophageal has been performed.  Ewing Fandino 02/12/2015, 1:16 PM

## 2015-02-12 NOTE — Interval H&P Note (Signed)
History and Physical Interval Note:  02/12/2015 11:55 AM  Cameron [name]  has presented today for surgery, with the diagnosis of bacterumia  The various methods of treatment have been discussed with the patient and family. After consideration of risks, benefits and other options for treatment, the patient has consented to  Procedure(s): TRANSESOPHAGEAL ECHOCARDIOGRAM (TEE) (N/A) as a surgical intervention .  The patient's history has been reviewed, patient examined, no change in status, stable for surgery.  I have reviewed the patient's chart and labs.  Questions were answered to the patient's satisfaction.     [name]

## 2015-02-12 NOTE — Progress Notes (Signed)
Peripherally Inserted Central Catheter/Midline Placement  The IV Nurse has discussed with the patient and/or persons authorized to consent for the patient, the purpose of this procedure and the potential benefits and risks involved with this procedure.  The benefits include less needle sticks, lab draws from the catheter and patient may be discharged home with the catheter.  Risks include, but not limited to, infection, bleeding, blood clot (thrombus formation), and puncture of an artery; nerve damage and irregular heat beat.  Alternatives to this procedure were also discussed.  PICC/Midline Placement Documentation        Cameron [name], Cameron [name] 02/12/2015, 3:16 PM

## 2015-02-12 NOTE — Discharge Instructions (Signed)
Keep a close eye on your blood sugars.  If they remain elevated please see your PCP  About it.

## 2015-02-12 NOTE — Progress Notes (Signed)
CARE MANAGEMENT NOTE 02/12/2015  Patient:  Cameron [name],Cameron [name]   Account Number:  [id number]  Date Initiated:  02/07/2015  Documentation initiated by:  [name]  Subjective/Objective Assessment:   infected left knee, 5wks post left TKA     Action/Plan:   PT eval- recommended HHPT   Anticipated DC Date:  02/08/2015   Anticipated DC Plan:  HOME W HOME HEALTH SERVICES      DC Planning Services  CM consult      [facility]   Choice offered to / List presented to:  [name]        HH arranged  HH-1 RN  IV Antibiotics  HH-2 PT      [facility]   Status of service:  Completed, signed off Medicare Important Message given?  YES Date Medicare IM given:  02/12/2015 Medicare IM given by:  [name]  Discharge Disposition:  HOME W HOME HEALTH SERVICES  Per UR Regulation:  Reviewed for med. necessity/level of care/duration of stay   Comments:  02/12/15 [facility] Massey Ruh[facility] and informed her that patient is receiving PICC this pm and is for d/c home today.   02/07/15 Contacted by Corrie DandyMary [facility] will be receiving home IV antibiotics and they will be providing [facility] and arrange for the Iv antibiotics. Will continue to follow for d/c needs.

## 2015-02-12 NOTE — H&P (View-Only) (Signed)
  [facility]    Subjective:   Feels better   Antibiotics:  Anti-infectives    Start     Dose/Rate Route Frequency Ordered Stop   02/08/15 1600  cefTRIAXone (ROCEPHIN) 2 g in dextrose 5 % 50 mL IVPB - Premix     2 g 100 mL/hr over 30 Minutes Intravenous Every 24 hours 02/08/15 1435     02/07/15 1400  vancomycin (VANCOCIN) IVPB 1000 mg/200 mL premix  Status:  Discontinued     1,000 mg 200 mL/hr over 60 Minutes Intravenous Every 12 hours 02/07/15 0843 02/08/15 1435   02/07/15 0200  ceFAZolin (ANCEF) IVPB 2 g/50 mL premix  Status:  Discontinued     2 g 100 mL/hr over 30 Minutes Intravenous 3 times per day 02/07/15 0008 02/08/15 1435   02/07/15 0030  vancomycin (VANCOCIN) 1,750 mg in sodium chloride 0.9 % 500 mL IVPB     1,750 mg 250 mL/hr over 120 Minutes Intravenous  Once 02/07/15 0018 02/07/15 0312      Medications: Scheduled Meds: . amLODipine  2.5 mg Oral QHS  . antiseptic oral rinse  7 mL Mouth Rinse BID  . aspirin EC  81 mg Oral Daily  . cefTRIAXone (ROCEPHIN)  IV  2 g Intravenous Q24H  . docusate sodium  100 mg Oral BID  . finasteride  5 mg Oral Daily  . glimepiride  4 mg Oral BID  . guaiFENesin  600 mg Oral BID  . insulin aspart  0-15 Units Subcutaneous TID WC  . linagliptin  5 mg Oral Daily  . metFORMIN  2,000 mg Oral Q supper  . pantoprazole  40 mg Oral Daily  . [START ON 02/12/2015] pneumococcal 23 valent vaccine  0.5 mL Intramuscular Tomorrow-1000   Continuous Infusions: . sodium chloride 100 mL/hr at 02/08/15 0133   PRN Meds:.acetaminophen **OR** acetaminophen, alum & mag hydroxide-simeth, bisacodyl, diphenhydrAMINE, HYDROmorphone (DILAUDID) injection, ibuprofen, methocarbamol, ondansetron **OR** ondansetron (ZOFRAN) IV, oxyCODONE-acetaminophen, polyethylene glycol, promethazine    Objective: Weight change:   Intake/Output Summary (Last 24 hours) at 02/11/15 1414 Last data filed at 02/10/15 1700  Gross per 24 hour  Intake     480 ml  Output      0 ml  Net    480 ml   Blood pressure 159/80, pulse 89, temperature 98.3 F (36.8 C), temperature source Oral, resp. rate 18, height 6' (1.829 m), weight 187 lb (84.823 kg), SpO2 100 %. Temp:  [98 F (36.7 C)-98.3 F (36.8 C)] 98.3 F (36.8 C) (02/15 1032) Pulse Rate:  [84-89] 89 (02/15 1032) Resp:  [18] 18 (02/15 1032) BP: (136-159)/(80) 159/80 mmHg (02/15 1032) SpO2:  [98 %-100 %] 100 % (02/15 1032)  Physical Exam: General: Alert and awake, oriented x3, not in any acute distress. HEENT: anicteric sclera, EOMI, oropharynx clear and without exudate CVS regular rate, normal r, no murmur rubs or gallops Chest: clear to auscultation bilaterally, no wheezing, rales or rhonchi Abdomen: soft nontender, nondistended, normal bowel sounds, Extremities: Left leg in brace  Skin: no rashes Neuro: nonfocal, strength and sensation intact   CBC:  CBC Latest Ref Rng 02/09/2015 02/08/2015 02/07/2015  WBC 4.0 - 10.5 K/uL 9.2 10.8(H) 15.2(H)  Hemoglobin 13.0 - 17.0 g/dL 9.4(L) 9.3(L) 10.2(L)  Hematocrit 39.0 - 52.0 % 29.4(L) 29.1(L) 31.5(L)  Platelets 150 - 400 K/uL 177 147(L) 160       BMET No results for input(s): NA, K, CL, CO2, GLUCOSE, BUN, CREATININE, CALCIUM in the last   72 hours.   Liver Panel  No results for input(s): PROT, ALBUMIN, AST, ALT, ALKPHOS, BILITOT, BILIDIR, IBILI in the last 72 hours.     Sedimentation Rate No results for input(s): ESRSEDRATE in the last 72 hours. C-Reactive Protein No results for input(s): CRP in the last 72 hours.  Micro Results: Recent Results (from the past 720 hour(s))  Anaerobic culture     Status: None   Collection Time: 02/06/15  6:19 PM  Result Value Ref Range Status   Specimen Description SYNOVIAL FLUID KNEE LEFT  Final   Special Requests PATIENT ON FOLLOWING ROCEPHIN  Final   Gram Stain   Final    ABUNDANT WBC PRESENT,BOTH PMN AND MONONUCLEAR FEW GRAM POSITIVE COCCI IN PAIRS IN CLUSTERS Gram Stain Report  Called to,Read Back By and Verified With: Gram Stain Report Called to,Read Back By and Verified With: DR GRAVES 1953 02/06/15 BY A BROWNING Performed at [facility] Performed at [facility]    Culture   Final    NO ANAEROBES ISOLATED Performed at [facility]    Report Status 02/11/2015 FINAL  Final  Gram stain     Status: None   Collection Time: 02/06/15  6:19 PM  Result Value Ref Range Status   Specimen Description SYNOVIAL FLUID KNEE LEFT  Final   Special Requests PATIENT ON FOLLOWING ROCEPHIN  Final   Gram Stain   Final    ABUNDANT WBC PRESENT,BOTH PMN AND MONONUCLEAR GRAM POSITIVE COCCI IN CLUSTERS IN PAIRS Gram Stain Report Called to,Read Back By and Verified With: DR GRAVES 1953 02/06/15 A BROWNING    Report Status 02/06/2015 FINAL  Final  Body fluid culture     Status: None   Collection Time: 02/06/15  6:19 PM  Result Value Ref Range Status   Specimen Description SYNOVIAL FLUID KNEE LEFT  Final   Special Requests PATIENT ON FOLLOWING ROCEPHIN  Final   Gram Stain   Final    ABUNDANT WBC PRESENT,BOTH PMN AND MONONUCLEAR FEW GRAM POSITIVE COCCI IN PAIRS IN CLUSTERS Gram Stain Report Called to,Read Back By and Verified With: Gram Stain Report Called to,Read Back By and Verified With: DR GRAVES 1953 02/06/15 BY A BROWNING Performed at [facility] Performed at [facility]    Culture   Final    ABUNDANT GROUP B STREP(S.AGALACTIAE)ISOLATED Performed at [facility]    Report Status 02/10/2015 FINAL  Final   Organism ID, Bacteria GROUP B STREP(S.AGALACTIAE)ISOLATED  Final      Susceptibility   Group b strep(s.agalactiae)isolated - MIC*    CLINDAMYCIN <=0.25 SENSITIVE Sensitive     ERYTHROMYCIN <=0.12 SENSITIVE Sensitive     LEVOFLOXACIN 1 SENSITIVE Sensitive     PENICILLIN <=0.06 SENSITIVE Sensitive     VANCOMYCIN 0.5 SENSITIVE Sensitive     AMPICILLIN <=0.25 SENSITIVE Sensitive     * ABUNDANT GROUP B  STREP(S.AGALACTIAE)ISOLATED  Anaerobic culture     Status: None   Collection Time: 02/06/15  6:26 PM  Result Value Ref Range Status   Specimen Description SYNOVIAL FLUID KNEE LEFT  Final   Special Requests   Final    PATIENT ON FOLLOWING ROCEPHIN DEEP KNEE RECEIVED SWAB   Gram Stain   Final    ABUNDANT WBC PRESENT,BOTH PMN AND MONONUCLEAR FEW GRAM POSITIVE COCCI IN PAIRS IN CLUSTERS Gram Stain Report Called to,Read Back By and Verified With: Gram Stain Report Called to,Read Back By and Verified With: DR GRAVES 1953 02/06/15 BY A BROWNING   Performed at [facility] Performed at [facility]    Culture   Final    NO ANAEROBES ISOLATED Performed at [facility]    Report Status 02/11/2015 FINAL  Final  Gram stain     Status: None   Collection Time: 02/06/15  6:26 PM  Result Value Ref Range Status   Specimen Description SYNOVIAL KNEE FLUID KNEE LEFT  Final   Special Requests   Final    PATIENT ON FOLLOWING ROCEPHIN DEEP KNEE RECEIVED SWAB   Gram Stain   Final    ABUNDANT WBC PRESENT,BOTH PMN AND MONONUCLEAR GRAM POSITIVE COCCI IN CHAINS IN CLUSTERS IN PAIRS Gram Stain Report Called to,Read Back By and Verified With: DR GRAVES 1953 02/06/15 A BROWNING    Report Status 02/06/2015 FINAL  Final  Body fluid culture     Status: None   Collection Time: 02/06/15  6:26 PM  Result Value Ref Range Status   Specimen Description SYNOVIAL FLUID KNEE LEFT  Final   Special Requests   Final    PATIENT ON FOLLOWING ROCEPHIN DEEP KNEE RECEIVED SWAB   Gram Stain   Final    ABUNDANT WBC PRESENT,BOTH PMN AND MONONUCLEAR FEW GRAM POSITIVE COCCI IN PAIRS IN CLUSTERS Gram Stain Report Called to,Read Back By and Verified With: Gram Stain Report Called to,Read Back By and Verified With: DR GRAVES 1953 02/06/15 BY A BROWNING Performed at [facility] Performed at [facility]    Culture   Final    ABUNDANT GROUP B STREP(S.AGALACTIAE)ISOLATED Note: SUSCEPTIBILITIES  PERFORMED ON PREVIOUS CULTURE WITHIN THE LAST 5 DAYS. Performed at [facility]    Report Status 02/10/2015 FINAL  Final  MRSA PCR Screening     Status: None   Collection Time: 02/07/15 12:22 AM  Result Value Ref Range Status   MRSA by PCR NEGATIVE NEGATIVE Final    Comment:        The GeneXpert MRSA Assay (FDA approved for NASAL specimens only), is one component of a comprehensive MRSA colonization surveillance program. It is not intended to diagnose MRSA infection nor to guide or monitor treatment for MRSA infections.   Culture, blood (routine x 2)     Status: None (Preliminary result)   Collection Time: 02/08/15  3:25 PM  Result Value Ref Range Status   Specimen Description BLOOD LEFT ARM  Final   Special Requests BOTTLES DRAWN AEROBIC AND ANAEROBIC 10CC  Final   Culture   Final           BLOOD CULTURE RECEIVED NO GROWTH TO DATE CULTURE WILL BE [name] FOR 5 DAYS BEFORE ISSUING A FINAL NEGATIVE REPORT Performed at [facility]    Report Status PENDING  Incomplete  Culture, blood (routine x 2)     Status: None (Preliminary result)   Collection Time: 02/08/15  3:40 PM  Result Value Ref Range Status   Specimen Description BLOOD LEFT HAND  Final   Special Requests BOTTLES DRAWN AEROBIC AND ANAEROBIC 10CC  Final   Culture   Final           BLOOD CULTURE RECEIVED NO GROWTH TO DATE CULTURE WILL BE [name] FOR 5 DAYS BEFORE ISSUING A FINAL NEGATIVE REPORT Performed at [facility]    Report Status PENDING  Incomplete    Studies/Results: No results found.    Assessment/Plan:  Principal Problem:   Infection of total left knee replacement Active Problems:   Diabetes   Infected prosthetic knee   joint   Infection of total knee replacement   Bacteremia due to group B Streptococcus    Cameron [name] is a 63 y.o. male with Septic total knee arthroplasty due to Group B streptococci and Group B streptococcal bacteremia sp post I&D and  poly-exchange.  #1 Group B Streptococcus Septic knee with Bacteremia  I have become more acutely aware of virulence of this organism with respect to heart valves  --I would like 2D echo  No vegetations  and I did ask Cardiology for TEE which they can do TOMORROW  --repeat blood cultures drawn  No growth  --continue Rocephin 2 grams daily  Ok to place PICC today or tomorrow   We'll plan on treating him for 6 weeks with parenteral antibiotics followed by protracted oral antibiotics, likely keflex  Dr.Campbell will  pick up the service tomorrow.   LOS: 5 days   Yovan Leeman [place] 02/11/2015, 2:14 PM   

## 2015-02-12 NOTE — Progress Notes (Signed)
Patient ID: Cameron [name], male   DOB: November 25, 1951, 64 y.o.   MRN: [id number]         [facility]    Date of Admission:  02/06/2015   Total days of antibiotics 7          Principal Problem:   Infection of total left knee replacement Active Problems:   Diabetes   Infected prosthetic knee joint   Infection of total knee replacement   Bacteremia due to group B Streptococcus   . amLODipine  2.5 mg Oral QHS  . antiseptic oral rinse  7 mL Mouth Rinse BID  . aspirin EC  81 mg Oral Daily  . cefTRIAXone (ROCEPHIN)  IV  2 g Intravenous Q24H  . docusate sodium  100 mg Oral BID  . finasteride  5 mg Oral Daily  . glimepiride  4 mg Oral BID  . guaiFENesin  600 mg Oral BID  . insulin aspart  0-15 Units Subcutaneous TID WC  . linagliptin  5 mg Oral Daily  . metFORMIN  2,000 mg Oral Q supper  . pantoprazole  40 mg Oral Daily  . pneumococcal 23 valent vaccine  0.5 mL Intramuscular Tomorrow-1000     Past Medical History  Diagnosis Date  . Hypertension   . Diabetes mellitus without complication   . GERD (gastroesophageal reflux disease)   . H/O renal calculi     passed several on his own, also has had procedures for    . Arthritis     in the knees, hands & neck   . Stroke     "small stroke" 10/2013, [facility]    History  Substance Use Topics  . Smoking status: Former Smoker    Quit date: 12/18/1973  . Smokeless tobacco: Not on file  . Alcohol Use: No    History reviewed. No pertinent family history. No Known Allergies  OBJECTIVE: Blood pressure 140/70, pulse 80, temperature 98.1 F (36.7 C), temperature source Oral, resp. rate 16, height 6' (1.829 m), weight 187 lb (84.823 kg), SpO2 93 %.  General: He is currently out of his room before transesophageal echocardiogram   Lab Results Lab Results  Component Value Date   WBC 9.2 02/09/2015   HGB 9.4* 02/09/2015   HCT 29.4* 02/09/2015   MCV 91.3 02/09/2015   PLT  177 02/09/2015    Lab Results  Component Value Date   CREATININE 0.98 02/07/2015   BUN 10 02/07/2015   NA 137 02/07/2015   K 3.9 02/07/2015   CL 104 02/07/2015   CO2 29 02/07/2015    Lab Results  Component Value Date   ALT 26 12/31/2014   AST 34 12/31/2014   ALKPHOS 65 12/31/2014   BILITOT 0.9 12/31/2014     Microbiology: Recent Results (from the past 240 hour(s))  Anaerobic culture     Status: None   Collection Time: 02/06/15  6:19 PM  Result Value Ref Range Status   Specimen Description SYNOVIAL FLUID KNEE LEFT  Final   Special Requests PATIENT ON FOLLOWING ROCEPHIN  Final   Gram Stain   Final    ABUNDANT WBC PRESENT,BOTH PMN AND MONONUCLEAR FEW GRAM POSITIVE COCCI IN PAIRS IN CLUSTERS Gram Stain Report Called to,Read Back By and Verified With: Gram Stain Report Called to,Read Back By and Verified With: DR GRAVES 1953 02/06/15 BY A BROWNING [facility]    Culture  Final    NO ANAEROBES ISOLATED Performed at [facility]    Report Status 02/11/2015 FINAL  Final  Gram stain     Status: None   Collection Time: 02/06/15  6:19 PM  Result Value Ref Range Status   Specimen Description SYNOVIAL FLUID KNEE LEFT  Final   Special Requests PATIENT ON FOLLOWING ROCEPHIN  Final   Gram Stain   Final    ABUNDANT WBC PRESENT,BOTH PMN AND MONONUCLEAR GRAM POSITIVE COCCI IN CLUSTERS IN PAIRS Gram Stain Report Called to,Read Back By and Verified With: DR GRAVES 1953 02/06/15 A BROWNING    Report Status 02/06/2015 FINAL  Final  Body fluid culture     Status: None   Collection Time: 02/06/15  6:19 PM  Result Value Ref Range Status   Specimen Description SYNOVIAL FLUID KNEE LEFT  Final   Special Requests PATIENT ON FOLLOWING ROCEPHIN  Final   Gram Stain   Final    ABUNDANT WBC PRESENT,BOTH PMN AND MONONUCLEAR FEW GRAM POSITIVE COCCI IN PAIRS IN CLUSTERS Gram Stain Report Called to,Read Back By and Verified With: Gram  Stain Report Called to,Read Back By and Verified With: DR GRAVES 1953 02/06/15 BY A BROWNING Performed at [facility] Performed at [facility]    Culture   Final    ABUNDANT GROUP B STREP(S.AGALACTIAE)ISOLATED Performed at [facility]    Report Status 02/10/2015 FINAL  Final   Organism ID, Bacteria GROUP B STREP(S.AGALACTIAE)ISOLATED  Final      Susceptibility   Group b strep(s.agalactiae)isolated - MIC*    CLINDAMYCIN <=0.25 SENSITIVE Sensitive     ERYTHROMYCIN <=0.12 SENSITIVE Sensitive     LEVOFLOXACIN 1 SENSITIVE Sensitive     PENICILLIN <=0.06 SENSITIVE Sensitive     VANCOMYCIN 0.5 SENSITIVE Sensitive     AMPICILLIN <=0.25 SENSITIVE Sensitive     * ABUNDANT GROUP B STREP(S.AGALACTIAE)ISOLATED  Anaerobic culture     Status: None   Collection Time: 02/06/15  6:26 PM  Result Value Ref Range Status   Specimen Description SYNOVIAL FLUID KNEE LEFT  Final   Special Requests   Final    PATIENT ON FOLLOWING ROCEPHIN DEEP KNEE RECEIVED SWAB   Gram Stain   Final    ABUNDANT WBC PRESENT,BOTH PMN AND MONONUCLEAR FEW GRAM POSITIVE COCCI IN PAIRS IN CLUSTERS Gram Stain Report Called to,Read Back By and Verified With: Gram Stain Report Called to,Read Back By and Verified With: DR GRAVES 1953 02/06/15 BY A BROWNING Performed at [facility] Performed at [facility]    Culture   Final    NO ANAEROBES ISOLATED Performed at [facility]    Report Status 02/11/2015 FINAL  Final  Gram stain     Status: None   Collection Time: 02/06/15  6:26 PM  Result Value Ref Range Status   Specimen Description SYNOVIAL KNEE FLUID KNEE LEFT  Final   Special Requests   Final    PATIENT ON FOLLOWING ROCEPHIN DEEP KNEE RECEIVED SWAB   Gram Stain   Final    ABUNDANT WBC PRESENT,BOTH PMN AND MONONUCLEAR GRAM POSITIVE COCCI IN CHAINS IN CLUSTERS IN PAIRS Gram Stain Report Called to,Read Back By and Verified With: DR GRAVES 1953 02/06/15 A BROWNING    Report  Status 02/06/2015 FINAL  Final  Body fluid culture     Status: None   Collection Time: 02/06/15  6:26 PM  Result Value Ref Range Status   Specimen Description SYNOVIAL FLUID KNEE LEFT  Final   Special Requests   Final    PATIENT ON FOLLOWING ROCEPHIN DEEP KNEE RECEIVED SWAB   Gram Stain   Final    ABUNDANT WBC PRESENT,BOTH PMN AND MONONUCLEAR FEW GRAM POSITIVE COCCI IN PAIRS IN CLUSTERS Gram Stain Report Called to,Read Back By and Verified With: Gram Stain Report Called to,Read Back By and Verified With: DR GRAVES 1953 02/06/15 BY A BROWNING Performed at [facility] Performed at [facility]    Culture   Final    ABUNDANT GROUP B STREP(S.AGALACTIAE)ISOLATED Note: SUSCEPTIBILITIES PERFORMED ON PREVIOUS CULTURE WITHIN THE LAST 5 DAYS. Performed at [facility]    Report Status 02/10/2015 FINAL  Final  MRSA PCR Screening     Status: None   Collection Time: 02/07/15 12:22 AM  Result Value Ref Range Status   MRSA by PCR NEGATIVE NEGATIVE Final    Comment:        The GeneXpert MRSA Assay (FDA approved for NASAL specimens only), is one component of a comprehensive MRSA colonization surveillance program. It is not intended to diagnose MRSA infection nor to guide or monitor treatment for MRSA infections.   Culture, blood (routine x 2)     Status: None (Preliminary result)   Collection Time: 02/08/15  3:25 PM  Result Value Ref Range Status   Specimen Description BLOOD LEFT ARM  Final   Special Requests BOTTLES DRAWN AEROBIC AND ANAEROBIC 10CC  Final   Culture   Final           BLOOD CULTURE RECEIVED NO GROWTH TO DATE CULTURE WILL BE HELD FOR 5 DAYS BEFORE ISSUING A FINAL NEGATIVE REPORT Performed at [facility]    Report Status PENDING  Incomplete  Culture, blood (routine x 2)     Status: None (Preliminary result)   Collection Time: 02/08/15  3:40 PM  Result Value Ref Range Status   Specimen Description BLOOD LEFT HAND  Final   Special Requests  BOTTLES DRAWN AEROBIC AND ANAEROBIC 10CC  Final   Culture   Final           BLOOD CULTURE RECEIVED NO GROWTH TO DATE CULTURE WILL BE HELD FOR 5 DAYS BEFORE ISSUING A FINAL NEGATIVE REPORT Performed at [facility]    Report Status PENDING  Incomplete   Transesophageal echocardiogram 02/12/2015: No valvular vegetations or other evidence of endocarditis  Assessment: He has group B streptococcal bacteremia complicated by left prosthetic knee infection. I agree with PICC placement and a total of 6 weeks of IV ceftriaxone before conversion to an oral antibiotic regimen.  Plan: 1. Continue ceftriaxone for 5 more weeks 2. Agree with PICC placement 3. Discharge home soon 4. I will arrange follow-up in our clinic within the next few weeks  Cliffton Asters, MD [facility] [phone] pager   [phone] cell 02/12/2015, 1:32 PM

## 2015-02-12 NOTE — CV Procedure (Signed)
    TEE Bacteremia  Normal EF No vegetations Trace TR and MR  See full report for details.   Donato SchultzSKAINS, Cameron Westley, MD

## 2015-02-12 NOTE — Progress Notes (Signed)
Subjective: 6 Days Post-Op Procedure(s) (LRB): IRRIGATION AND DEBRIDEMENT LEFT KNEE WITH POLY EXCHANGE (Left) with septic left total knee replacement and bacteremia. Patient reports pain as moderate.  Getting TEE this morning. Is complaining of some increased left knee pain. Taking by  mouth and voiding okay  Objective: Vital signs in last 24 hours: Temp:  [98.2 F (36.8 C)-98.7 F (37.1 C)] 98.7 F (37.1 C) (02/16 0608) Pulse Rate:  [78-89] 88 (02/16 0608) Resp:  [18] 18 (02/15 1032) BP: (127-159)/(59-80) 138/66 mmHg (02/16 0608) SpO2:  [99 %-100 %] 99 % (02/16 0608)  Intake/Output from previous day: 02/15 0701 - 02/16 0700 In: 250 [P.O.:250] Out: 702 [Urine:700; Stool:2] Intake/Output this shift:   Left knee exam: Moderate pain with range of motion. Calf soft and nontender. NV intact to foot. Dressing clean and dry.  Assessment/Plan: 6 Days Post-Op Procedure(s) (LRB): IRRIGATION AND DEBRIDEMENT LEFT KNEE WITH POLY EXCHANGE (Left) with septic left knee status post left knee replacement with bacteremia. Plan: For TEE today Will order a PICC line. If the TEE looks okay we will discharge him home later today on 6 weeks of Rocephin 2 [name] daily IV. He will need home health physical therapy. Weight-bear as tolerated on left with aggressive range of motion. Follow-up with Dr. Luiz [name] in 10 days.   Cameron [name] 02/12/2015, 9:32 AM

## 2015-02-13 ENCOUNTER — Encounter (HOSPITAL_COMMUNITY): Payer: Self-pay | Admitting: Cardiology

## 2015-02-13 NOTE — Discharge Summary (Signed)
Patient ID: STORM SOVINE MRN: [id number] DOB/AGE: 1951/04/15 64 y.o.  Admit date: 02/06/2015 Discharge date: 02/12/2015  Admission Diagnoses:  Principal Problem:   Infection of total left knee replacement Active Problems:   Diabetes   Infected prosthetic knee joint   Infection of total knee replacement   Bacteremia due to group B Streptococcus   Discharge Diagnoses:  Same  Past Medical History  Diagnosis Date  . Hypertension   . Diabetes mellitus without complication   . GERD (gastroesophageal reflux disease)   . H/O renal calculi     passed several on his own, also has had procedures for    . Arthritis     in the knees, hands & neck   . Stroke     "small stroke" 10/2013, states he had care at [place]: Procedure(s): IRRIGATION AND DEBRIDEMENT LEFT KNEE WITH POLY EXCHANGE on 02/06/2015 - 02/07/2015   Consultants:   infectious disease     Dr. Drucilla Schmidt  Discharged Condition: Improved  Hospital Course: JAZIER MCGLAMERY is an 64 y.o. male who was admitted 02/06/2015 for operative treatment ofInfection of total left knee replacement. Patient has severe unremitting pain that affects sleep, daily activities, and work/hobbies. He presented with increased knee pain on the left, swelling, limited motion, and fever. A left knee aspiration confirmed pus. Unfortunately this was indicative of a infected left total knee 5 weeks status post knee replacement. After pre-op clearance the patient was taken to the operating room on 02/06/2015 - 02/07/2015 and underwent  Procedure(s): IRRIGATION AND DEBRIDEMENT LEFT KNEE WITH POLY EXCHANGE.    Patient was given perioperative antibiotics: Anti-infectives    Start     Dose/Rate Route Frequency Ordered Stop   02/12/15 0000  cefTRIAXone (ROCEPHIN) 40 MG/ML IVPB     2 g 100 mL/hr over 30 Minutes Intravenous Every 24 hours 02/12/15 0938     02/08/15 1600  cefTRIAXone (ROCEPHIN) 2 g in dextrose 5 % 50 mL IVPB - Premix  Status:   Discontinued     2 g 100 mL/hr over 30 Minutes Intravenous Every 24 hours 02/08/15 1435 02/12/15 2025   02/07/15 1400  vancomycin (VANCOCIN) IVPB 1000 mg/200 mL premix  Status:  Discontinued     1,000 mg 200 mL/hr over 60 Minutes Intravenous Every 12 hours 02/07/15 0843 02/08/15 1435   02/07/15 0200  ceFAZolin (ANCEF) IVPB 2 g/50 mL premix  Status:  Discontinued     2 g 100 mL/hr over 30 Minutes Intravenous 3 times per day 02/07/15 0008 02/08/15 1435   02/07/15 0030  vancomycin (VANCOCIN) 1,750 mg in sodium chloride 0.9 % 500 mL IVPB     1,750 mg 250 mL/hr over 120 Minutes Intravenous  Once 02/07/15 0018 02/07/15 3818       Patient was given sequential compression devices, early ambulation, and chemoprophylaxis to prevent DVT. Infectious disease was consulted and appreciated. His cultures grew out group B strep. There were concerns of septicemia. The patient was started on IV vancomycin initially and once cultures were completed he was converted to IV Rocephin 2 g daily. A 2-D echocardiogram was performed as well as a TEE. A TEE was negative. Patient had 2 large bore drains in his left knee which were removed on 02/10/15. Patient was seen by physical therapy. He was prescribed aspirin and SCDs for DVT prophylaxis. On the date of discharge the patient was afebrile. He did complain of moderate left knee pain. His left knee wound was benign. His  calf is soft and nontender.  Patient benefited maximally from hospital stay and there were no complications.    Recent vital signs: Patient Vitals for the past 24 hrs:  BP Temp Temp src Pulse Resp SpO2  02/12/15 1342 134/68 mmHg 97.9 F (36.6 C) Oral 81 18 99 %  02/12/15 1330 140/70 mmHg - - 80 16 93 %  02/12/15 1324 137/75 mmHg 98.1 F (36.7 C) Oral - - -  02/12/15 1323 - - - 86 14 96 %  02/12/15 1310 (!) 128/57 mmHg - - 79 14 95 %  02/12/15 1305 132/77 mmHg - - 85 15 97 %  02/12/15 1300 (!) 145/69 mmHg - - 82 (!) 22 100 %  02/12/15 1255 (!) 142/64  mmHg - - 79 19 99 %  02/12/15 1250 (!) 146/72 mmHg - - 75 (!) 22 99 %  02/12/15 1245 (!) 153/63 mmHg - - 77 11 100 %  02/12/15 1240 (!) 162/78 mmHg - - 76 13 100 %  02/12/15 1235 (!) 166/78 mmHg - - 79 12 100 %  02/12/15 1230 138/80 mmHg - - 74 12 100 %  02/12/15 1229 138/80 mmHg - - 78 16 100 %  02/12/15 1135 137/79 mmHg 98.3 F (36.8 C) Oral - 17 99 %    Recent Results (from the past 2160 hour(s))  Basic metabolic panel     Status: Abnormal   Collection Time: 12/18/14 10:20 AM  Result Value Ref Range   Sodium 137 135 - 145 mmol/L    Comment: Please note change in reference range.   Potassium 4.0 3.5 - 5.1 mmol/L    Comment: Please note change in reference range.   Chloride 100 96 - 112 mEq/L   CO2 26 19 - 32 mmol/L   Glucose, Bld 241 (H) 70 - 99 mg/dL   BUN 15 6 - 23 mg/dL   Creatinine, Ser 0.95 0.50 - 1.35 mg/dL   Calcium 9.4 8.4 - 10.5 mg/dL   GFR calc non Af Amer 85 (L) >90 mL/min   GFR calc Af Amer >90 >90 mL/min    Comment: (NOTE) The eGFR has been calculated using the CKD EPI equation. This calculation has not been validated in all clinical situations. eGFR's persistently <90 mL/min signify possible Chronic Kidney Disease.    Anion gap 11 5 - 15  CBC     Status: None   Collection Time: 12/18/14 10:20 AM  Result Value Ref Range   WBC 8.0 4.0 - 10.5 K/uL   RBC 4.59 4.22 - 5.81 MIL/uL   Hemoglobin 14.6 13.0 - 17.0 g/dL   HCT 42.2 39.0 - 52.0 %   MCV 91.9 78.0 - 100.0 fL   MCH 31.8 26.0 - 34.0 pg   MCHC 34.6 30.0 - 36.0 g/dL   RDW 12.8 11.5 - 15.5 %   Platelets 257 150 - 400 K/uL  Protime-INR     Status: None   Collection Time: 12/18/14 10:20 AM  Result Value Ref Range   Prothrombin Time 12.9 11.6 - 15.2 seconds   INR 0.96 0.00 - 1.49  PTT     Status: None   Collection Time: 12/18/14 10:20 AM  Result Value Ref Range   aPTT 30 24 - 37 seconds  Type and screen     Status: None   Collection Time: 12/18/14 10:20 AM  Result Value Ref Range   ABO/RH(D) O NEG     Antibody Screen NEG    Sample Expiration 01/01/2015  ABO/Rh     Status: None   Collection Time: 12/18/14 10:20 AM  Result Value Ref Range   ABO/RH(D) O NEG   Surgical pcr screen     Status: None   Collection Time: 12/18/14 10:21 AM  Result Value Ref Range   MRSA, PCR NEGATIVE NEGATIVE   Staphylococcus aureus NEGATIVE NEGATIVE    Comment:        The Xpert SA Assay (FDA approved for NASAL specimens in patients over 60 years of age), is one component of a comprehensive surveillance program.  Test performance has been validated by [employer] for patients greater than or equal to 51 year old. It is not intended to diagnose infection nor to guide or monitor treatment.   Urinalysis, Routine w reflex microscopic     Status: Abnormal   Collection Time: 12/31/14  1:07 PM  Result Value Ref Range   Color, Urine YELLOW YELLOW   APPearance CLEAR CLEAR   Specific Gravity, Urine 1.024 1.005 - 1.030   pH 6.5 5.0 - 8.0   Glucose, UA NEGATIVE NEGATIVE mg/dL   Hgb urine dipstick NEGATIVE NEGATIVE   Bilirubin Urine NEGATIVE NEGATIVE   Ketones, ur 15 (A) NEGATIVE mg/dL   Protein, ur NEGATIVE NEGATIVE mg/dL   Urobilinogen, UA 1.0 0.0 - 1.0 mg/dL   Nitrite NEGATIVE NEGATIVE   Leukocytes, UA NEGATIVE NEGATIVE    Comment: MICROSCOPIC NOT DONE ON URINES WITH NEGATIVE PROTEIN, BLOOD, LEUKOCYTES, NITRITE, OR GLUCOSE <1000 mg/dL.  CBC WITH DIFFERENTIAL     Status: Abnormal   Collection Time: 12/31/14  1:08 PM  Result Value Ref Range   WBC 8.2 4.0 - 10.5 K/uL   RBC 4.61 4.22 - 5.81 MIL/uL   Hemoglobin 14.2 13.0 - 17.0 g/dL   HCT 41.2 39.0 - 52.0 %   MCV 89.4 78.0 - 100.0 fL   MCH 30.8 26.0 - 34.0 pg   MCHC 34.5 30.0 - 36.0 g/dL   RDW 12.8 11.5 - 15.5 %   Platelets 252 150 - 400 K/uL   Neutrophils Relative % 55 43 - 77 %   Neutro Abs 4.6 1.7 - 7.7 K/uL   Lymphocytes Relative 31 12 - 46 %   Lymphs Abs 2.6 0.7 - 4.0 K/uL   Monocytes Relative 6 3 - 12 %   Monocytes Absolute 0.5 0.1 - 1.0  K/uL   Eosinophils Relative 7 (H) 0 - 5 %   Eosinophils Absolute 0.6 0.0 - 0.7 K/uL   Basophils Relative 1 0 - 1 %   Basophils Absolute 0.0 0.0 - 0.1 K/uL  Comprehensive metabolic panel     Status: Abnormal   Collection Time: 12/31/14  1:08 PM  Result Value Ref Range   Sodium 139 135 - 145 mmol/L    Comment: Please note change in reference range.   Potassium 4.0 3.5 - 5.1 mmol/L    Comment: Please note change in reference range.   Chloride 104 96 - 112 mEq/L   CO2 23 19 - 32 mmol/L   Glucose, Bld 166 (H) 70 - 99 mg/dL   BUN 13 6 - 23 mg/dL   Creatinine, Ser 0.85 0.50 - 1.35 mg/dL   Calcium 9.0 8.4 - 10.5 mg/dL   Total Protein 7.6 6.0 - 8.3 g/dL   Albumin 4.5 3.5 - 5.2 g/dL   AST 34 0 - 37 U/L   ALT 26 0 - 53 U/L   Alkaline Phosphatase 65 39 - 117 U/L   Total Bilirubin 0.9 0.3 -  1.2 mg/dL   GFR calc non Af Amer 89 (L) >90 mL/min   GFR calc Af Amer >90 >90 mL/min    Comment: (NOTE) The eGFR has been calculated using the CKD EPI equation. This calculation has not been validated in all clinical situations. eGFR's persistently <90 mL/min signify possible Chronic Kidney Disease.    Anion gap 12 5 - 15  Glucose, capillary     Status: Abnormal   Collection Time: 12/31/14  1:24 PM  Result Value Ref Range   Glucose-Capillary 160 (H) 70 - 99 mg/dL  Glucose, capillary     Status: Abnormal   Collection Time: 12/31/14  6:06 PM  Result Value Ref Range   Glucose-Capillary 123 (H) 70 - 99 mg/dL  Glucose, capillary     Status: Abnormal   Collection Time: 12/31/14  9:45 PM  Result Value Ref Range   Glucose-Capillary 149 (H) 70 - 99 mg/dL  CBC     Status: Abnormal   Collection Time: 01/01/15  5:57 AM  Result Value Ref Range   WBC 10.2 4.0 - 10.5 K/uL   RBC 3.75 (L) 4.22 - 5.81 MIL/uL   Hemoglobin 11.9 (L) 13.0 - 17.0 g/dL   HCT 34.7 (L) 39.0 - 52.0 %   MCV 92.5 78.0 - 100.0 fL   MCH 31.7 26.0 - 34.0 pg   MCHC 34.3 30.0 - 36.0 g/dL   RDW 13.1 11.5 - 15.5 %   Platelets 216 150 - 400  K/uL  Basic metabolic panel     Status: Abnormal   Collection Time: 01/01/15  5:57 AM  Result Value Ref Range   Sodium 139 135 - 145 mmol/L    Comment: Please note change in reference range.   Potassium 3.9 3.5 - 5.1 mmol/L    Comment: Please note change in reference range.   Chloride 106 96 - 112 mEq/L   CO2 24 19 - 32 mmol/L   Glucose, Bld 179 (H) 70 - 99 mg/dL   BUN 12 6 - 23 mg/dL   Creatinine, Ser 1.08 0.50 - 1.35 mg/dL   Calcium 8.2 (L) 8.4 - 10.5 mg/dL   GFR calc non Af Amer 70 (L) >90 mL/min   GFR calc Af Amer 81 (L) >90 mL/min    Comment: (NOTE) The eGFR has been calculated using the CKD EPI equation. This calculation has not been validated in all clinical situations. eGFR's persistently <90 mL/min signify possible Chronic Kidney Disease.    Anion gap 9 5 - 15  Glucose, capillary     Status: Abnormal   Collection Time: 02/06/15  4:56 PM  Result Value Ref Range   Glucose-Capillary 201 (H) 70 - 99 mg/dL  Anaerobic culture     Status: None   Collection Time: 02/06/15  6:19 PM  Result Value Ref Range   Specimen Description SYNOVIAL FLUID KNEE LEFT    Special Requests PATIENT ON FOLLOWING ROCEPHIN    Gram Stain      ABUNDANT WBC PRESENT,BOTH PMN AND MONONUCLEAR FEW GRAM POSITIVE COCCI IN PAIRS IN CLUSTERS Gram Stain Report Called to,Read Back By and Verified With: Gram Stain Report Called to,Read Back By and Verified With: DR GRAVES 1953 02/06/15 BY A BROWNING Performed at [facility] Performed at [facility] Performed at [facility]    Report Status 02/11/2015 FINAL   Gram stain     Status: None   Collection Time: 02/06/15  6:19 PM  Result Value Ref Range   Specimen Description SYNOVIAL FLUID KNEE LEFT    Special Requests PATIENT ON FOLLOWING ROCEPHIN    Gram Stain      ABUNDANT WBC PRESENT,BOTH PMN AND MONONUCLEAR GRAM POSITIVE COCCI IN CLUSTERS IN PAIRS Gram Stain Report Called to,Read Back By and  Verified With: DR GRAVES 1953 02/06/15 A BROWNING    Report Status 02/06/2015 FINAL   Body fluid culture     Status: None   Collection Time: 02/06/15  6:19 PM  Result Value Ref Range   Specimen Description SYNOVIAL FLUID KNEE LEFT    Special Requests PATIENT ON FOLLOWING ROCEPHIN    Gram Stain      ABUNDANT WBC PRESENT,BOTH PMN AND MONONUCLEAR FEW GRAM POSITIVE COCCI IN PAIRS IN CLUSTERS Gram Stain Report Called to,Read Back By and Verified With: Gram Stain Report Called to,Read Back By and Verified With: DR GRAVES 1953 02/06/15 BY A BROWNING Performed at [facility] Performed at [place] B STREP(S.AGALACTIAE)ISOLATED Performed at [facility]    Report Status 02/10/2015 FINAL    Organism ID, Bacteria GROUP B STREP(S.AGALACTIAE)ISOLATED       Susceptibility   Group b strep(s.agalactiae)isolated - MIC*    CLINDAMYCIN <=0.25 SENSITIVE Sensitive     ERYTHROMYCIN <=0.12 SENSITIVE Sensitive     LEVOFLOXACIN 1 SENSITIVE Sensitive     PENICILLIN <=0.06 SENSITIVE Sensitive     VANCOMYCIN 0.5 SENSITIVE Sensitive     AMPICILLIN <=0.25 SENSITIVE Sensitive     * ABUNDANT GROUP B STREP(S.AGALACTIAE)ISOLATED  Synovial cell count + diff, w/ crystals     Status: Abnormal   Collection Time: 02/06/15  6:19 PM  Result Value Ref Range   Color, Synovial PINK (A) YELLOW   Appearance-Synovial TURBID (A) CLEAR   Crystals, Fluid NO CRYSTALS SEEN    WBC, Synovial 244000 (H) 0 - 200 /cu mm    Comment: COUNT MAY BE INACCURATE DUE TO FIBRIN CLUMPS.   Neutrophil, Synovial 99 (H) 0 - 25 %    Comment: CELLULAR DEGENERATION NOTED   Lymphocytes-Synovial Fld 0 0 - 20 %   Monocyte-Macrophage-Synovial Fluid 1 (L) 50 - 90 %   Eosinophils-Synovial 0 0 - 1 %   Other Cells-SYN INTRACELLULAR BACTERIA NOTED   Anaerobic culture     Status: None   Collection Time: 02/06/15  6:26 PM  Result Value Ref Range   Specimen Description SYNOVIAL FLUID KNEE LEFT     Special Requests      PATIENT ON FOLLOWING ROCEPHIN DEEP KNEE RECEIVED SWAB   Gram Stain      ABUNDANT WBC PRESENT,BOTH PMN AND MONONUCLEAR FEW GRAM POSITIVE COCCI IN PAIRS IN CLUSTERS Gram Stain Report Called to,Read Back By and Verified With: Gram Stain Report Called to,Read Back By and Verified With: DR GRAVES 1953 02/06/15 BY A BROWNING Performed at [facility] Performed at [place] Performed at [facility]    Report Status 02/11/2015 FINAL   Gram stain     Status: None   Collection Time: 02/06/15  6:26 PM  Result Value Ref Range   Specimen Description SYNOVIAL KNEE FLUID KNEE LEFT    Special Requests      PATIENT ON FOLLOWING ROCEPHIN DEEP KNEE RECEIVED SWAB   Gram Stain      ABUNDANT WBC PRESENT,BOTH PMN AND MONONUCLEAR GRAM POSITIVE  COCCI IN CHAINS IN CLUSTERS IN PAIRS Gram Stain Report Called to,Read Back By and Verified With: DR GRAVES 1953 02/06/15 A BROWNING    Report Status 02/06/2015 FINAL   Body fluid culture     Status: None   Collection Time: 02/06/15  6:26 PM  Result Value Ref Range   Specimen Description SYNOVIAL FLUID KNEE LEFT    Special Requests      PATIENT ON FOLLOWING ROCEPHIN DEEP KNEE RECEIVED SWAB   Gram Stain      ABUNDANT WBC PRESENT,BOTH PMN AND MONONUCLEAR FEW GRAM POSITIVE COCCI IN PAIRS IN CLUSTERS Gram Stain Report Called to,Read Back By and Verified With: Gram Stain Report Called to,Read Back By and Verified With: DR GRAVES 1953 02/06/15 BY A BROWNING Performed at [facility] Performed at [place] B STREP(S.AGALACTIAE)ISOLATED Note: SUSCEPTIBILITIES PERFORMED ON PREVIOUS CULTURE WITHIN THE LAST 5 DAYS. Performed at [facility]    Report Status 02/10/2015 FINAL   Glucose, capillary     Status: Abnormal   Collection Time: 02/06/15  8:04 PM  Result Value Ref Range   Glucose-Capillary 195 (H) 70 - 99 mg/dL  MRSA PCR  Screening     Status: None   Collection Time: 02/07/15 12:22 AM  Result Value Ref Range   MRSA by PCR NEGATIVE NEGATIVE    Comment:        The GeneXpert MRSA Assay (FDA approved for NASAL specimens only), is one component of a comprehensive MRSA colonization surveillance program. It is not intended to diagnose MRSA infection nor to guide or monitor treatment for MRSA infections.   CBC     Status: Abnormal   Collection Time: 02/07/15  5:00 AM  Result Value Ref Range   WBC 15.2 (H) 4.0 - 10.5 K/uL   RBC 3.38 (L) 4.22 - 5.81 MIL/uL   Hemoglobin 10.2 (L) 13.0 - 17.0 g/dL   HCT 31.5 (L) 39.0 - 52.0 %   MCV 93.2 78.0 - 100.0 fL   MCH 30.2 26.0 - 34.0 pg   MCHC 32.4 30.0 - 36.0 g/dL   RDW 13.6 11.5 - 15.5 %   Platelets 160 150 - 400 K/uL  Basic metabolic panel     Status: Abnormal   Collection Time: 02/07/15  5:00 AM  Result Value Ref Range   Sodium 137 135 - 145 mmol/L   Potassium 3.9 3.5 - 5.1 mmol/L   Chloride 104 96 - 112 mmol/L   CO2 29 19 - 32 mmol/L   Glucose, Bld 191 (H) 70 - 99 mg/dL   BUN 10 6 - 23 mg/dL   Creatinine, Ser 0.98 0.50 - 1.35 mg/dL   Calcium 7.8 (L) 8.4 - 10.5 mg/dL   GFR calc non Af Amer 84 (L) >90 mL/min   GFR calc Af Amer >90 >90 mL/min    Comment: (NOTE) The eGFR has been calculated using the CKD EPI equation. This calculation has not been validated in all clinical situations. eGFR's persistently <90 mL/min signify possible Chronic Kidney Disease.    Anion gap 4 (L) 5 - 15  Hemoglobin A1c     Status: Abnormal   Collection Time: 02/07/15  5:00 AM  Result Value Ref Range   Hgb A1c MFr Bld 7.7 (H) 4.8 - 5.6 %    Comment: (NOTE)         Pre-diabetes: 5.7 - 6.4         Diabetes: >6.4  Glycemic control for adults with diabetes: <7.0    Mean Plasma Glucose 174 mg/dL    Comment: (NOTE) Performed At: [facility] [place], [place] [id number] Lindon Romp MD UX:[id number]   Glucose, capillary     Status: Abnormal    Collection Time: 02/07/15 11:16 AM  Result Value Ref Range   Glucose-Capillary 220 (H) 70 - 99 mg/dL  Glucose, capillary     Status: Abnormal   Collection Time: 02/07/15  4:54 PM  Result Value Ref Range   Glucose-Capillary 171 (H) 70 - 99 mg/dL  Glucose, capillary     Status: Abnormal   Collection Time: 02/07/15  9:53 PM  Result Value Ref Range   Glucose-Capillary 194 (H) 70 - 99 mg/dL  CBC     Status: Abnormal   Collection Time: 02/08/15  6:30 AM  Result Value Ref Range   WBC 10.8 (H) 4.0 - 10.5 K/uL   RBC 3.11 (L) 4.22 - 5.81 MIL/uL   Hemoglobin 9.3 (L) 13.0 - 17.0 g/dL   HCT 29.1 (L) 39.0 - 52.0 %   MCV 93.6 78.0 - 100.0 fL   MCH 29.9 26.0 - 34.0 pg   MCHC 32.0 30.0 - 36.0 g/dL   RDW 13.7 11.5 - 15.5 %   Platelets 147 (L) 150 - 400 K/uL  HIV antibody     Status: None   Collection Time: 02/08/15  6:30 AM  Result Value Ref Range   HIV Screen 4th Generation wRfx Non Reactive Non Reactive    Comment: (NOTE) Performed At: [facility] [address] [place], [place] [id number] Lindon Romp MD VQ:[id number]   Hepatitis panel, acute     Status: None   Collection Time: 02/08/15  6:30 AM  Result Value Ref Range   Hepatitis B Surface Ag NEGATIVE NEGATIVE   HCV Ab NEGATIVE NEGATIVE   Hep A IgM NON REACTIVE NON REACTIVE    Comment: (NOTE) Effective November 12, 2014, Hepatitis Acute Panel (test code 22940) will be revised to automatically reflex to the Hepatitis C Viral RNA, Quantitative, Real-Time PCR assay if the Hepatitis C antibody screening result is Reactive. This action is being taken to ensure that the CDC/USPSTF recommended HCV diagnostic algorithm with the appropriate test reflex needed for accurate interpretation is followed.    Hep B C IgM NON REACTIVE NON REACTIVE    Comment: (NOTE) High levels of Hepatitis B Core IgM antibody are detectable during the acute stage of Hepatitis B. This antibody is used to differentiate current from past HBV  infection. Performed at [facility]   Sedimentation rate     Status: Abnormal   Collection Time: 02/08/15  6:30 AM  Result Value Ref Range   Sed Rate 66 (H) 0 - 16 mm/hr  C-reactive protein     Status: Abnormal   Collection Time: 02/08/15  6:30 AM  Result Value Ref Range   CRP 27.0 (H) <0.60 mg/dL    Comment: Performed at [facility]  Glucose, capillary     Status: Abnormal   Collection Time: 02/08/15  6:48 AM  Result Value Ref Range   Glucose-Capillary 173 (H) 70 - 99 mg/dL  Glucose, capillary     Status: Abnormal   Collection Time: 02/08/15 11:56 AM  Result Value Ref Range   Glucose-Capillary 172 (H) 70 - 99 mg/dL  Culture, blood (routine x 2)     Status: None (Preliminary result)   Collection Time: 02/08/15  3:25 PM  Result  Value Ref Range   Specimen Description BLOOD LEFT ARM    Special Requests BOTTLES DRAWN AEROBIC AND ANAEROBIC 10CC    Culture             BLOOD CULTURE RECEIVED NO GROWTH TO DATE CULTURE WILL BE HELD FOR 5 DAYS BEFORE ISSUING A FINAL NEGATIVE REPORT Performed at [facility]    Report Status PENDING   Culture, blood (routine x 2)     Status: None (Preliminary result)   Collection Time: 02/08/15  3:40 PM  Result Value Ref Range   Specimen Description BLOOD LEFT HAND    Special Requests BOTTLES DRAWN AEROBIC AND ANAEROBIC 10CC    Culture             BLOOD CULTURE RECEIVED NO GROWTH TO DATE CULTURE WILL BE HELD FOR 5 DAYS BEFORE ISSUING A FINAL NEGATIVE REPORT Performed at [facility]    Report Status PENDING   Glucose, capillary     Status: Abnormal   Collection Time: 02/08/15  5:08 PM  Result Value Ref Range   Glucose-Capillary 148 (H) 70 - 99 mg/dL  Glucose, capillary     Status: Abnormal   Collection Time: 02/08/15 10:36 PM  Result Value Ref Range   Glucose-Capillary 120 (H) 70 - 99 mg/dL  CBC     Status: Abnormal   Collection Time: 02/09/15  5:29 AM  Result Value Ref Range   WBC 9.2 4.0 - 10.5 K/uL   RBC  3.22 (L) 4.22 - 5.81 MIL/uL   Hemoglobin 9.4 (L) 13.0 - 17.0 g/dL   HCT 29.4 (L) 39.0 - 52.0 %   MCV 91.3 78.0 - 100.0 fL   MCH 29.2 26.0 - 34.0 pg   MCHC 32.0 30.0 - 36.0 g/dL   RDW 13.4 11.5 - 15.5 %   Platelets 177 150 - 400 K/uL  Glucose, capillary     Status: Abnormal   Collection Time: 02/09/15  6:27 AM  Result Value Ref Range   Glucose-Capillary 153 (H) 70 - 99 mg/dL  Glucose, capillary     Status: Abnormal   Collection Time: 02/09/15 12:08 PM  Result Value Ref Range   Glucose-Capillary 176 (H) 70 - 99 mg/dL  Glucose, capillary     Status: Abnormal   Collection Time: 02/09/15  4:07 PM  Result Value Ref Range   Glucose-Capillary 152 (H) 70 - 99 mg/dL  Glucose, capillary     Status: Abnormal   Collection Time: 02/09/15  9:34 PM  Result Value Ref Range   Glucose-Capillary 177 (H) 70 - 99 mg/dL  Glucose, capillary     Status: Abnormal   Collection Time: 02/10/15  6:03 AM  Result Value Ref Range   Glucose-Capillary 137 (H) 70 - 99 mg/dL  Glucose, capillary     Status: Abnormal   Collection Time: 02/10/15 12:22 PM  Result Value Ref Range   Glucose-Capillary 179 (H) 70 - 99 mg/dL  Glucose, capillary     Status: Abnormal   Collection Time: 02/10/15  4:52 PM  Result Value Ref Range   Glucose-Capillary 132 (H) 70 - 99 mg/dL  Glucose, capillary     Status: Abnormal   Collection Time: 02/10/15  9:20 PM  Result Value Ref Range   Glucose-Capillary 145 (H) 70 - 99 mg/dL  Glucose, capillary     Status: Abnormal   Collection Time: 02/11/15  6:40 AM  Result Value Ref Range   Glucose-Capillary 167 (H) 70 - 99 mg/dL  Glucose, capillary  Status: Abnormal   Collection Time: 02/11/15 11:25 AM  Result Value Ref Range   Glucose-Capillary 144 (H) 70 - 99 mg/dL   Comment 1 Documented in Char   Glucose, capillary     Status: Abnormal   Collection Time: 02/11/15  4:22 PM  Result Value Ref Range   Glucose-Capillary 205 (H) 70 - 99 mg/dL  Glucose, capillary     Status: None    Collection Time: 02/11/15  9:24 PM  Result Value Ref Range   Glucose-Capillary 86 70 - 99 mg/dL  Glucose, capillary     Status: Abnormal   Collection Time: 02/12/15  6:38 AM  Result Value Ref Range   Glucose-Capillary 194 (H) 70 - 99 mg/dL  Glucose, capillary     Status: Abnormal   Collection Time: 02/12/15 11:09 AM  Result Value Ref Range   Glucose-Capillary 124 (H) 70 - 99 mg/dL  Glucose, capillary     Status: Abnormal   Collection Time: 02/12/15  1:48 PM  Result Value Ref Range   Glucose-Capillary 106 (H) 70 - 99 mg/dL  Glucose, capillary     Status: Abnormal   Collection Time: 02/12/15  4:18 PM  Result Value Ref Range   Glucose-Capillary 234 (H) 70 - 99 mg/dL   Comment 1 Notify RN      Discharge Medications:     Medication List    TAKE these medications        amLODipine 2.5 MG tablet  Commonly known as:  NORVASC  Take 2.5 mg by mouth at bedtime.     aspirin EC 325 MG tablet  Take 1 tablet (325 mg total) by mouth 2 (two) times daily after a meal.     cefTRIAXone 40 MG/ML IVPB  Commonly known as:  ROCEPHIN  Inject 50 mLs (2 g total) into the vein daily.     finasteride 5 MG tablet  Commonly known as:  PROSCAR  Take 1 tablet by mouth daily.     glimepiride 4 MG tablet  Commonly known as:  AMARYL  Take 4 mg by mouth 2 (two) times daily.     guaiFENesin 600 MG 12 hr tablet  Commonly known as:  MUCINEX  Take 600 mg by mouth 2 (two) times daily.     metFORMIN 1000 MG tablet  Commonly known as:  GLUCOPHAGE  Take 2,000 mg by mouth at bedtime.     methocarbamol 750 MG tablet  Commonly known as:  ROBAXIN-750  Take 1 tablet (750 mg total) by mouth every 8 (eight) hours as needed for muscle spasms.     oxyCODONE-acetaminophen 5-325 MG per tablet  Commonly known as:  PERCOCET/ROXICET  Take 1-2 tablets by mouth every 4 (four) hours as needed for severe pain.     RED YEAST RICE PO  Take 2 tablets by mouth every morning.     sitaGLIPtin 100 MG tablet  Commonly  known as:  JANUVIA  Take 100 mg by mouth daily before breakfast.        Diagnostic Studies: TEE within normal limits. Disposition: 06-Home-Health Care Svc      Discharge Instructions    Call MD / Call 911    Complete by:  As directed   If you experience chest pain or shortness of breath, CALL 911 and be transported to the hospital emergency room.  If you develope a fever above 101 F, pus (white drainage) or increased drainage or redness at the wound, or calf pain, call your surgeon's office.  Constipation Prevention    Complete by:  As directed   Drink plenty of fluids.  Prune juice may be helpful.  You may use a stool softener, such as Colace (over the counter) 100 mg twice a day.  Use MiraLax (over the counter) for constipation as needed.     Diet Carb Modified    Complete by:  As directed      Do not put a pillow under the knee. Place it under the heel.    Complete by:  As directed      Increase activity slowly as tolerated    Complete by:  As directed      Weight bearing as tolerated    Complete by:  As directed   Laterality:  left  Extremity:  Lower           Follow-up Information    Follow up with GRAVES,JOHN L, MD. Schedule an appointment as soon as possible for a visit in 10 days.   Specialty:  Orthopedic Surgery   Contact information:   [place] [address] [phone]       Follow up with [facility].   Why:  They will contact you to schedule home nurse and theapy visits.   Contact information:   [place] [address] [place] [address] [phone]        Signed: Erlene Senters 02/13/2015, 11:34 AM

## 2015-02-14 LAB — CULTURE, BLOOD (ROUTINE X 2)
Culture: NO GROWTH
Culture: NO GROWTH

## 2015-03-18 ENCOUNTER — Telehealth: Payer: Self-pay | Admitting: *Deleted

## 2015-03-18 NOTE — Telephone Encounter (Signed)
Received call from Amy at [facility] looking for lab orders on patient. Per Amy, patient has been discharged from [facility] and has been coming to them for PICC care (assessment, dressing change).  He has not had labs drawn in 2 weeks. Homehealth care was d/c'd 2 weeks ago by Dr. Luiz BlareGraves, orthopedic surgeon, as the patient is going to physical therapy and therefore is no longer homebound.  Patient still administering IV Rocephin (2gm q 24 24 hours) received from [facility] in [place].  They however have not received any labs from [facility], and neither has [facility].  Patient scheduled to complete IV rocephin on 3/30 via [facility]. RN requested all labs from [facility], placed in Dr. Zenaida NieceVan Dam's box.   Contact information for participants in care is as follows: Pharmacy - [facility] [place]: [address]ricia [address], [phone] Surgeon - Dr Luiz BlareGraves: [phone] PICC care - [facility]: Linton Rumpmy or Dondra SpryGail, [phone] phone, [phone] fax [facility] Health [facility] [place]: (patient has been discharged from their service via Dr. Luiz BlareGraves) [phone]

## 2015-03-19 NOTE — Telephone Encounter (Signed)
Very good 

## 2015-03-19 NOTE — Telephone Encounter (Signed)
Patient should finish his rocephin at end of the month and then go onto keflex 500mg  two tablets twice daily and PICC can be pulled at that time  He DOES NEED to be seen in [facility] for followup. We can check labs then if not yet done

## 2015-03-19 NOTE — Telephone Encounter (Signed)
Patient scheduled for follow up with you 3/23.

## 2015-03-20 ENCOUNTER — Ambulatory Visit (INDEPENDENT_AMBULATORY_CARE_PROVIDER_SITE_OTHER): Payer: Medicare Other | Admitting: Infectious Disease

## 2015-03-20 ENCOUNTER — Encounter: Payer: Self-pay | Admitting: Infectious Disease

## 2015-03-20 VITALS — BP 145/83 | HR 89 | Temp 98.7°F | Wt 187.0 lb

## 2015-03-20 DIAGNOSIS — R7881 Bacteremia: Secondary | ICD-10-CM

## 2015-03-20 DIAGNOSIS — T8453XD Infection and inflammatory reaction due to internal right knee prosthesis, subsequent encounter: Secondary | ICD-10-CM

## 2015-03-20 DIAGNOSIS — M009 Pyogenic arthritis, unspecified: Secondary | ICD-10-CM

## 2015-03-20 DIAGNOSIS — B951 Streptococcus, group B, as the cause of diseases classified elsewhere: Secondary | ICD-10-CM

## 2015-03-20 DIAGNOSIS — T8454XD Infection and inflammatory reaction due to internal left knee prosthesis, subsequent encounter: Secondary | ICD-10-CM

## 2015-03-20 DIAGNOSIS — A419 Sepsis, unspecified organism: Secondary | ICD-10-CM | POA: Insufficient documentation

## 2015-03-20 DIAGNOSIS — R6521 Severe sepsis with septic shock: Secondary | ICD-10-CM

## 2015-03-20 LAB — BASIC METABOLIC PANEL WITH GFR
BUN: 19 mg/dL (ref 6–23)
CALCIUM: 9.6 mg/dL (ref 8.4–10.5)
CO2: 27 mEq/L (ref 19–32)
CREATININE: 0.82 mg/dL (ref 0.50–1.35)
Chloride: 102 mEq/L (ref 96–112)
GFR, Est African American: >89 mL/min
GFR, Est Non African American: >89 mL/min
Glucose, Bld: 169 mg/dL — ABNORMAL HIGH (ref 70–99)
Potassium: 4.7 mEq/L (ref 3.5–5.3)
SODIUM: 140 meq/L (ref 135–145)

## 2015-03-20 LAB — CBC WITH DIFFERENTIAL/PLATELET
Basophils Absolute: 0 10*3/uL (ref 0.0–0.1)
Basophils Relative: 0 % (ref 0–1)
EOS ABS: 0.7 10*3/uL (ref 0.0–0.7)
Eosinophils Relative: 7 % — ABNORMAL HIGH (ref 0–5)
HCT: 35.1 % — ABNORMAL LOW (ref 39.0–52.0)
HEMOGLOBIN: 11.1 g/dL — AB (ref 13.0–17.0)
LYMPHS ABS: 2.2 10*3/uL (ref 0.7–4.0)
Lymphocytes Relative: 23 % (ref 12–46)
MCH: 27.5 pg (ref 26.0–34.0)
MCHC: 31.6 g/dL (ref 30.0–36.0)
MCV: 86.9 fL (ref 78.0–100.0)
MPV: 8.5 fL — ABNORMAL LOW (ref 8.6–12.4)
Monocytes Absolute: 0.6 10*3/uL (ref 0.1–1.0)
Monocytes Relative: 6 % (ref 3–12)
Neutro Abs: 6.1 10*3/uL (ref 1.7–7.7)
Neutrophils Relative %: 64 % (ref 43–77)
PLATELETS: 395 10*3/uL (ref 150–400)
RBC: 4.04 MIL/uL — ABNORMAL LOW (ref 4.22–5.81)
RDW: 14.8 % (ref 11.5–15.5)
WBC: 9.6 10*3/uL (ref 4.0–10.5)

## 2015-03-20 LAB — C-REACTIVE PROTEIN: CRP: 0.9 mg/dL — AB (ref <0.60)

## 2015-03-20 MED ORDER — AMOXICILLIN 500 MG PO CAPS: 500.0000 mg | ORAL_CAPSULE | Freq: Three times a day (TID) | ORAL | Status: DC

## 2015-03-20 NOTE — Progress Notes (Signed)
   Subjective:    Patient ID: Cameron [name], male    DOB: [birth year], 64 y.o.   MRN: [id number]  HPI  64 y.o. male with Septic total knee arthroplasty due to Group B streptococci and Group B streptococcal bacteremia sp post I&D and poly-exchange He cleared his bacteremia and TEE was negative. He has been on effective antibiotics including Rocephin 2g daily since DC with tomorrow being postop day #42. He has enough abx thru Tuesday. APparently he lost home health due to no longer being "home bound."  His knee is dramatically better now only 5/10 in severity pain and more so with walking bearing weight than when at rest.      Review of Systems  Constitutional: Negative for fever, chills, diaphoresis, activity change, appetite change, fatigue and unexpected weight change.  HENT: Negative for congestion, rhinorrhea, sinus pressure, sneezing, sore throat and trouble swallowing.   Eyes: Negative for photophobia and visual disturbance.  Respiratory: Negative for cough, chest tightness, shortness of breath, wheezing and stridor.   Cardiovascular: Negative for chest pain, palpitations and leg swelling.  Gastrointestinal: Negative for nausea, vomiting, abdominal pain, diarrhea, constipation, blood in stool, abdominal distention and anal bleeding.  Genitourinary: Negative for dysuria, hematuria, flank pain and difficulty urinating.  Musculoskeletal: Positive for myalgias and joint swelling. Negative for back pain, arthralgias and gait problem.  Skin: Negative for color change, pallor, rash and wound.  Neurological: Negative for dizziness, tremors, weakness and light-headedness.  Hematological: Negative for adenopathy. Does not bruise/bleed easily.  Psychiatric/Behavioral: Negative for behavioral problems, confusion, sleep disturbance, dysphoric mood, decreased concentration and agitation.       Objective:   Physical Exam  Constitutional: He is oriented to person, place, and time. He appears  well-developed and well-nourished.  HENT:  Head: Normocephalic and atraumatic.  Eyes: Conjunctivae and EOM are normal.  Neck: Normal range of motion. Neck supple.  Cardiovascular: Normal rate and regular rhythm.   Pulmonary/Chest: Effort normal. No respiratory distress. He has no wheezes.  Abdominal: Soft. He exhibits no distension.  Musculoskeletal: Normal range of motion. He exhibits no edema or tenderness.       Legs: Neurological: He is alert and oriented to person, place, and time.  Skin: Skin is warm and dry. No rash noted. No pallor.  Psychiatric: He has a normal mood and affect. His behavior is normal. Judgment and thought content normal.     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 We had extensive discussion re nature  Of his infection signs and ssx to watch for and we coordinated his abx and picc line care

## 2015-03-21 LAB — SEDIMENTATION RATE: Sed Rate: 26 mm/hr — ABNORMAL HIGH (ref 0–20)

## 2015-04-05 ENCOUNTER — Other Ambulatory Visit (HOSPITAL_COMMUNITY)
Admission: RE | Admit: 2015-04-05 | Discharge: 2015-04-05 | Disposition: A | Discharge status: Home / Self Care | Payer: Medicare Other | Source: Ambulatory Visit | Attending: Orthopedic Surgery | Admitting: Orthopedic Surgery

## 2015-04-05 DIAGNOSIS — E119 Type 2 diabetes mellitus without complications: Secondary | ICD-10-CM | POA: Diagnosis present

## 2015-04-05 LAB — CBC WITH DIFFERENTIAL/PLATELET
Basophils Absolute: 0.1 10*3/uL (ref 0.0–0.1)
Basophils Relative: 0 % (ref 0–1)
EOS ABS: 0.8 10*3/uL — AB (ref 0.0–0.7)
Eosinophils Relative: 6 % — ABNORMAL HIGH (ref 0–5)
HCT: 38.7 % — ABNORMAL LOW (ref 39.0–52.0)
Hemoglobin: 12.5 g/dL — ABNORMAL LOW (ref 13.0–17.0)
Lymphocytes Relative: 28 % (ref 12–46)
Lymphs Abs: 3.3 10*3/uL (ref 0.7–4.0)
MCH: 27.8 pg (ref 26.0–34.0)
MCHC: 32.3 g/dL (ref 30.0–36.0)
MCV: 86 fL (ref 78.0–100.0)
Monocytes Absolute: 0.6 10*3/uL (ref 0.1–1.0)
Monocytes Relative: 5 % (ref 3–12)
Neutro Abs: 7.2 10*3/uL (ref 1.7–7.7)
Neutrophils Relative %: 61 % (ref 43–77)
PLATELETS: 320 10*3/uL (ref 150–400)
RBC: 4.5 MIL/uL (ref 4.22–5.81)
RDW: 14.2 % (ref 11.5–15.5)
WBC: 11.9 10*3/uL — ABNORMAL HIGH (ref 4.0–10.5)

## 2015-04-05 LAB — C-REACTIVE PROTEIN: CRP: 1.5 mg/dL — AB (ref <0.60)

## 2015-04-05 LAB — SEDIMENTATION RATE: Sed Rate: 29 mm/hr — ABNORMAL HIGH (ref 0–16)

## 2015-05-21 ENCOUNTER — Encounter: Payer: Self-pay | Admitting: Infectious Disease

## 2015-05-21 ENCOUNTER — Ambulatory Visit (INDEPENDENT_AMBULATORY_CARE_PROVIDER_SITE_OTHER): Payer: Medicare Other | Admitting: Infectious Disease

## 2015-05-21 VITALS — BP 137/82 | HR 67 | Temp 98.3°F | Wt 186.0 lb

## 2015-05-21 DIAGNOSIS — T8454XD Infection and inflammatory reaction due to internal left knee prosthesis, subsequent encounter: Secondary | ICD-10-CM

## 2015-05-21 DIAGNOSIS — R6521 Severe sepsis with septic shock: Secondary | ICD-10-CM

## 2015-05-21 DIAGNOSIS — B951 Streptococcus, group B, as the cause of diseases classified elsewhere: Secondary | ICD-10-CM

## 2015-05-21 DIAGNOSIS — A419 Sepsis, unspecified organism: Secondary | ICD-10-CM

## 2015-05-21 DIAGNOSIS — T8450XD Infection and inflammatory reaction due to unspecified internal joint prosthesis, subsequent encounter: Secondary | ICD-10-CM

## 2015-05-21 DIAGNOSIS — R7881 Bacteremia: Secondary | ICD-10-CM

## 2015-05-21 DIAGNOSIS — T8459XD Infection and inflammatory reaction due to other internal joint prosthesis, subsequent encounter: Secondary | ICD-10-CM

## 2015-05-21 DIAGNOSIS — T8453XD Infection and inflammatory reaction due to internal right knee prosthesis, subsequent encounter: Secondary | ICD-10-CM | POA: Diagnosis not present

## 2015-05-21 DIAGNOSIS — Z96659 Presence of unspecified artificial knee joint: Principal | ICD-10-CM

## 2015-05-21 DIAGNOSIS — E119 Type 2 diabetes mellitus without complications: Secondary | ICD-10-CM

## 2015-05-21 HISTORY — DX: Type 2 diabetes mellitus without complications: E11.9

## 2015-05-21 LAB — CBC WITH DIFFERENTIAL/PLATELET
Basophils Absolute: 0.1 10*3/uL (ref 0.0–0.1)
Basophils Relative: 1 % (ref 0–1)
Eosinophils Absolute: 0.8 10*3/uL — ABNORMAL HIGH (ref 0.0–0.7)
Eosinophils Relative: 10 % — ABNORMAL HIGH (ref 0–5)
HEMATOCRIT: 40.5 % (ref 39.0–52.0)
HEMOGLOBIN: 13.4 g/dL (ref 13.0–17.0)
Lymphocytes Relative: 31 % (ref 12–46)
Lymphs Abs: 2.5 10*3/uL (ref 0.7–4.0)
MCH: 28.1 pg (ref 26.0–34.0)
MCHC: 33.1 g/dL (ref 30.0–36.0)
MCV: 84.9 fL (ref 78.0–100.0)
MONOS PCT: 7 % (ref 3–12)
MPV: 8.8 fL (ref 8.6–12.4)
Monocytes Absolute: 0.6 10*3/uL (ref 0.1–1.0)
Neutro Abs: 4.2 10*3/uL (ref 1.7–7.7)
Neutrophils Relative %: 51 % (ref 43–77)
Platelets: 272 10*3/uL (ref 150–400)
RBC: 4.77 MIL/uL (ref 4.22–5.81)
RDW: 16.6 % — ABNORMAL HIGH (ref 11.5–15.5)
WBC: 8.2 10*3/uL (ref 4.0–10.5)

## 2015-05-21 LAB — BASIC METABOLIC PANEL WITH GFR
BUN: 13 mg/dL (ref 6–23)
CO2: 26 mEq/L (ref 19–32)
Calcium: 9.4 mg/dL (ref 8.4–10.5)
Chloride: 103 mEq/L (ref 96–112)
Creat: 0.83 mg/dL (ref 0.50–1.35)
GFR, Est African American: >89 mL/min
Glucose, Bld: 109 mg/dL — ABNORMAL HIGH (ref 70–99)
POTASSIUM: 4.8 meq/L (ref 3.5–5.3)
Sodium: 139 mEq/L (ref 135–145)

## 2015-05-21 LAB — C-REACTIVE PROTEIN: CRP: <0.5 mg/dL (ref <0.60)

## 2015-05-21 LAB — SEDIMENTATION RATE: SED RATE: 4 mm/h (ref 0–20)

## 2015-05-21 NOTE — Progress Notes (Signed)
   Subjective:    Patient ID: Cameron [name], male    DOB: [birth year], 64 y.o.   MRN: [id number]  HPI   64 y.o. male with Septic total knee arthroplasty due to Group B streptococci and Group B streptococcal bacteremia sp post I&D and poly-exchange in February.  He cleared his bacteremia and TEE was negative. He has been on effective antibiotics including Rocephin 2g daily since DC with tomorrow being postop day #42.  Apparently he lost home health due to no longer being "home bound." He was changed to oral amoxicillin.  His knee was  dramatically better now only 5/10 in severity pain and more so with walking bearing weight than when at rest and on amoxicillin 500 mg tid.   Today he states that his knee pain is still there "like a toothache" but still 10X better than he was in the hospital and gradually getting better. It is worst when he tries to lie down and with weight bearing as well.     Review of Systems  Constitutional: Negative for fever, chills, diaphoresis, activity change, appetite change, fatigue and unexpected weight change.  HENT: Negative for congestion, rhinorrhea, sinus pressure, sneezing, sore throat and trouble swallowing.   Eyes: Negative for photophobia and visual disturbance.  Respiratory: Negative for cough, chest tightness, shortness of breath, wheezing and stridor.   Cardiovascular: Negative for chest pain, palpitations and leg swelling.  Gastrointestinal: Negative for nausea, vomiting, abdominal pain, diarrhea, constipation, blood in stool, abdominal distention and anal bleeding.  Genitourinary: Negative for dysuria, hematuria, flank pain and difficulty urinating.  Musculoskeletal: Positive for myalgias and joint swelling. Negative for back pain, arthralgias and gait problem.  Skin: Negative for color change, pallor, rash and wound.  Neurological: Negative for dizziness, tremors, weakness and light-headedness.  Hematological: Negative for adenopathy. Does not  bruise/bleed easily.  Psychiatric/Behavioral: Negative for behavioral problems, confusion, sleep disturbance, dysphoric mood, decreased concentration and agitation.       Objective:   Physical Exam  Constitutional: He is oriented to person, place, and time. He appears well-developed and well-nourished.  HENT:  Head: Normocephalic and atraumatic.  Eyes: Conjunctivae and EOM are normal.  Neck: Normal range of motion. Neck supple.  Cardiovascular: Normal rate and regular rhythm.   Pulmonary/Chest: Effort normal. No respiratory distress. He has no wheezes.  Abdominal: Soft. He exhibits no distension.  Musculoskeletal: Normal range of motion. He exhibits no edema or tenderness.       Legs: Neurological: He is alert and oriented to person, place, and time.  Skin: Skin is warm and dry. No rash noted. No pallor.  Psychiatric: He has a normal mood and affect. His behavior is normal. Judgment and thought content normal.     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

## 2015-05-22 ENCOUNTER — Telehealth: Payer: Self-pay | Admitting: *Deleted

## 2015-05-22 NOTE — Telephone Encounter (Signed)
Patient notified

## 2015-05-22 NOTE — Telephone Encounter (Signed)
-----   Message from Randall Hissornelius N Van Dam, MD sent at 05/22/2015  8:58 AM EDT ----- Annice PihJackie can you tell this guy he can go to just BID on his amoxicillin if he wants instead of tid?

## 2015-06-24 ENCOUNTER — Other Ambulatory Visit: Payer: Self-pay

## 2015-07-22 ENCOUNTER — Ambulatory Visit: Payer: Medicare Other | Admitting: Infectious Disease

## 2015-07-23 ENCOUNTER — Ambulatory Visit: Payer: Medicare Other | Admitting: Infectious Disease

## 2015-08-20 ENCOUNTER — Ambulatory Visit (INDEPENDENT_AMBULATORY_CARE_PROVIDER_SITE_OTHER): Payer: Medicare Other | Admitting: Infectious Disease

## 2015-08-20 ENCOUNTER — Encounter: Payer: Self-pay | Admitting: Infectious Disease

## 2015-08-20 VITALS — BP 147/83 | HR 74 | Temp 98.2°F | Wt 187.0 lb

## 2015-08-20 DIAGNOSIS — Z96659 Presence of unspecified artificial knee joint: Secondary | ICD-10-CM

## 2015-08-20 DIAGNOSIS — B951 Streptococcus, group B, as the cause of diseases classified elsewhere: Secondary | ICD-10-CM

## 2015-08-20 DIAGNOSIS — T8450XD Infection and inflammatory reaction due to unspecified internal joint prosthesis, subsequent encounter: Secondary | ICD-10-CM

## 2015-08-20 DIAGNOSIS — R6521 Severe sepsis with septic shock: Secondary | ICD-10-CM

## 2015-08-20 DIAGNOSIS — E119 Type 2 diabetes mellitus without complications: Secondary | ICD-10-CM | POA: Diagnosis not present

## 2015-08-20 DIAGNOSIS — T8459XD Infection and inflammatory reaction due to other internal joint prosthesis, subsequent encounter: Secondary | ICD-10-CM

## 2015-08-20 DIAGNOSIS — A419 Sepsis, unspecified organism: Secondary | ICD-10-CM | POA: Diagnosis not present

## 2015-08-20 DIAGNOSIS — R7881 Bacteremia: Secondary | ICD-10-CM

## 2015-08-20 DIAGNOSIS — T8453XD Infection and inflammatory reaction due to internal right knee prosthesis, subsequent encounter: Secondary | ICD-10-CM | POA: Diagnosis not present

## 2015-08-20 DIAGNOSIS — T8454XD Infection and inflammatory reaction due to internal left knee prosthesis, subsequent encounter: Secondary | ICD-10-CM

## 2015-08-20 LAB — SEDIMENTATION RATE: Sed Rate: 4 mm/hr (ref 0–20)

## 2015-08-20 LAB — C-REACTIVE PROTEIN: CRP: <0.5 mg/dL (ref <0.60)

## 2015-08-20 NOTE — Progress Notes (Signed)
   Subjective:    Patient ID: Cameron [name], male    DOB: Nov 23, 1951, 64 y.o.   MRN: [id number]  HPI   64 y.o. male with Septic total knee arthroplasty due to Group B streptococci and Group B streptococcal bacteremia sp post I&D and poly-exchange in February.  He cleared his bacteremia and TEE was negative. He has been on effective antibiotics including Rocephin 2g daily since DC with tomorrow being postop day #42.  Apparently he lost home health due to no longer being "home bound." He was changed to oral amoxicillin.  His knee was  dramatically better now only 5/10 in severity pain and more so with walking bearing weight than when at rest and on amoxicillin 500 mg tid.   Today as with last visit he states that his pain is  "like a toothache" but still better than he was in the hospital and gradually getting better. It is worst when he tries to lie down  actually better while walking.      Review of Systems  Constitutional: Negative for fever, chills, diaphoresis, activity change, appetite change, fatigue and unexpected weight change.  HENT: Negative for congestion, rhinorrhea, sinus pressure, sneezing, sore throat and trouble swallowing.   Eyes: Negative for photophobia and visual disturbance.  Respiratory: Negative for cough, chest tightness, shortness of breath, wheezing and stridor.   Cardiovascular: Negative for chest pain, palpitations and leg swelling.  Gastrointestinal: Negative for nausea, vomiting, abdominal pain, diarrhea, constipation, blood in stool, abdominal distention and anal bleeding.  Genitourinary: Negative for dysuria, hematuria, flank pain and difficulty urinating.  Musculoskeletal: Positive for myalgias and joint swelling. Negative for back pain, arthralgias and gait problem.  Skin: Negative for color change, pallor, rash and wound.  Neurological: Negative for dizziness, tremors, weakness and light-headedness.  Hematological: Negative for adenopathy. Does not  bruise/bleed easily.  Psychiatric/Behavioral: Negative for behavioral problems, confusion, sleep disturbance, dysphoric mood, decreased concentration and agitation.       Objective:   Physical Exam  Constitutional: He is oriented to person, place, and time. He appears well-developed and well-nourished.  HENT:  Head: Normocephalic and atraumatic.  Eyes: Conjunctivae and EOM are normal.  Neck: Normal range of motion. Neck supple.  Cardiovascular: Normal rate and regular rhythm.   Pulmonary/Chest: Effort normal. No respiratory distress. He has no wheezes.  Abdominal: Soft. He exhibits no distension.  Musculoskeletal: Normal range of motion. He exhibits no edema or tenderness.       Legs: Neurological: He is alert and oriented to person, place, and time.  Skin: Skin is warm and dry. No rash noted. No pallor.  Psychiatric: He has a normal mood and affect. His behavior is normal. Judgment and thought content normal.     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

## 2015-09-03 ENCOUNTER — Other Ambulatory Visit: Payer: Self-pay | Admitting: Infectious Disease

## 2015-11-25 ENCOUNTER — Ambulatory Visit: Payer: Medicare Other | Admitting: Infectious Disease

## 2015-12-04 ENCOUNTER — Ambulatory Visit (INDEPENDENT_AMBULATORY_CARE_PROVIDER_SITE_OTHER): Payer: Medicare Other | Admitting: Infectious Disease

## 2015-12-04 ENCOUNTER — Encounter: Payer: Self-pay | Admitting: Infectious Disease

## 2015-12-04 VITALS — BP 144/80 | HR 82 | Temp 98.5°F | Wt 195.8 lb

## 2015-12-04 DIAGNOSIS — T8450XD Infection and inflammatory reaction due to unspecified internal joint prosthesis, subsequent encounter: Secondary | ICD-10-CM | POA: Diagnosis not present

## 2015-12-04 DIAGNOSIS — T8450XS Infection and inflammatory reaction due to unspecified internal joint prosthesis, sequela: Secondary | ICD-10-CM | POA: Diagnosis present

## 2015-12-04 DIAGNOSIS — T8459XS Infection and inflammatory reaction due to other internal joint prosthesis, sequela: Secondary | ICD-10-CM

## 2015-12-04 DIAGNOSIS — R7881 Bacteremia: Secondary | ICD-10-CM

## 2015-12-04 DIAGNOSIS — B951 Streptococcus, group B, as the cause of diseases classified elsewhere: Secondary | ICD-10-CM

## 2015-12-04 DIAGNOSIS — T8459XD Infection and inflammatory reaction due to other internal joint prosthesis, subsequent encounter: Secondary | ICD-10-CM

## 2015-12-04 DIAGNOSIS — Z96659 Presence of unspecified artificial knee joint: Principal | ICD-10-CM

## 2015-12-04 LAB — BASIC METABOLIC PANEL WITH GFR
BUN: 16 mg/dL (ref 7–25)
CALCIUM: 9.3 mg/dL (ref 8.6–10.3)
CO2: 26 mmol/L (ref 20–31)
Chloride: 103 mmol/L (ref 98–110)
Creat: 1.23 mg/dL (ref 0.70–1.25)
GFR, Est African American: 70 mL/min (ref >=60)
GFR, Est Non African American: 61 mL/min (ref >=60)
Glucose, Bld: 181 mg/dL — ABNORMAL HIGH (ref 65–99)
Potassium: 4 mmol/L (ref 3.5–5.3)
SODIUM: 139 mmol/L (ref 135–146)

## 2015-12-04 LAB — C-REACTIVE PROTEIN: CRP: <0.5 mg/dL (ref <0.60)

## 2015-12-04 NOTE — Progress Notes (Signed)
Subjective:    Patient ID: Cameron [name], male    DOB: [birth year], 64 y.o.   MRN: [id number]  HPI   64 y.o. male with Septic total knee arthroplasty due to Group B streptococci and Group B streptococcal bacteremia sp post I&D and poly-exchange in February.  He cleared his bacteremia and TEE was negative. He has been on effective antibiotics including Rocephin 2g daily since DC with tomorrow being postop day #42.  Apparently he lost home health due to no longer being "home bound." He was changed to oral amoxicillin.  His knee was  dramatically better to  only 5/10 in severity pain and more so with walking bearing weight than when at rest and on amoxicillin 500 mg tid.   At last visit we rechecked his labs which were encouraging and allowed him to come off oral abx after he had stopped them. He states that his pain is dramatically better and he can now play golf again.   Past Medical History  Diagnosis Date  . Hypertension   . Diabetes mellitus without complication ([place])   . GERD (gastroesophageal reflux disease)   . H/O renal calculi     passed several on his own, also has had procedures for    . Arthritis     in the knees, hands & neck   . Stroke [facility]     "small stroke" 10/2013, states he had care at [facility]  . Diabetes mellitus type 2, controlled ([place]) 05/21/2015    Past Surgical History  Procedure Laterality Date  . Back surgery  1995    fusion- [facility]   . Appendectomy  2005  . Cholecystectomy    . Knee arthroscopy Left   . Total knee arthroplasty Left 12/31/2014    Procedure: TOTAL KNEE ARTHROPLASTY;  Surgeon: Alta Corning, MD;  Location: [place];  Service: Orthopedics;  Laterality: Left;  Left knee arthroplasty  . I&d knee with poly exchange Left 02/06/2015    Procedure: IRRIGATION AND DEBRIDEMENT LEFT KNEE WITH POLY EXCHANGE;  Surgeon: Alta Corning, MD;  Location: [place];  Service: Orthopedics;  Laterality: Left;  . Tee without cardioversion  N/A 02/12/2015    Procedure: TRANSESOPHAGEAL ECHOCARDIOGRAM (TEE);  Surgeon: Candee Furbish, MD;  Location: [facility] ENDOSCOPY;  Service: Cardiovascular;  Laterality: N/A;    No family history on file.    Social History   Social History  . Marital Status: Single    Spouse Name: N/A  . Number of Children: N/A  . Years of Education: N/A   Social History Main Topics  . Smoking status: Former Smoker    Quit date: 12/18/1973  . Smokeless tobacco: Never Used  . Alcohol Use: No  . Drug Use: No  . Sexual Activity: Not Asked   Other Topics Concern  . None   Social History Narrative    No Known Allergies   Current outpatient prescriptions:  .  amLODipine (NORVASC) 2.5 MG tablet, Take 2.5 mg by mouth at bedtime. , Disp: , Rfl:  .  aspirin EC 325 MG tablet, Take 1 tablet (325 mg total) by mouth 2 (two) times daily after a meal., Disp: 60 tablet, Rfl: 0 .  finasteride (PROSCAR) 5 MG tablet, Take 1 tablet by mouth daily., Disp: , Rfl: 11 .  glimepiride (AMARYL) 4 MG tablet, Take 4 mg by mouth 2 (two) times daily., Disp: , Rfl:  .  guaiFENesin (MUCINEX) 600 MG 12 hr tablet, Take 600 mg by mouth  2 (two) times daily., Disp: , Rfl:  .  metFORMIN (GLUCOPHAGE) 1000 MG tablet, Take 2,000 mg by mouth at bedtime. , Disp: , Rfl:  .  Red Yeast Rice Extract (RED YEAST RICE PO), Take 2 tablets by mouth every morning., Disp: , Rfl:       Review of Systems  Constitutional: Negative for fever, chills, diaphoresis, activity change, appetite change, fatigue and unexpected weight change.  HENT: Negative for congestion, rhinorrhea, sinus pressure, sneezing, sore throat and trouble swallowing.   Eyes: Negative for photophobia and visual disturbance.  Respiratory: Negative for cough, chest tightness, shortness of breath, wheezing and stridor.   Cardiovascular: Negative for chest pain, palpitations and leg swelling.  Gastrointestinal: Negative for nausea, vomiting, abdominal pain, diarrhea, constipation, blood  in stool, abdominal distention and anal bleeding.  Genitourinary: Negative for dysuria, hematuria, flank pain and difficulty urinating.  Musculoskeletal: Positive for myalgias and joint swelling. Negative for back pain, arthralgias and gait problem.  Skin: Negative for color change, pallor, rash and wound.  Neurological: Negative for dizziness, tremors, weakness and light-headedness.  Hematological: Negative for adenopathy. Does not bruise/bleed easily.  Psychiatric/Behavioral: Negative for behavioral problems, confusion, sleep disturbance, dysphoric mood, decreased concentration and agitation.       Objective:   Physical Exam  Constitutional: He is oriented to person, place, and time. He appears well-developed and well-nourished.  HENT:  Head: Normocephalic and atraumatic.  Eyes: Conjunctivae and EOM are normal.  Neck: Normal range of motion. Neck supple.  Cardiovascular: Normal rate and regular rhythm.   Pulmonary/Chest: Effort normal. No respiratory distress. He has no wheezes.  Abdominal: Soft. He exhibits no distension.  Musculoskeletal: Normal range of motion. He exhibits no edema or tenderness.       Legs: Neurological: He is alert and oriented to person, place, and time.  Skin: Skin is warm and dry. No rash noted. No pallor.  Psychiatric: He has a normal mood and affect. His behavior is normal. Judgment and thought content normal.     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

## 2015-12-05 LAB — SEDIMENTATION RATE: Sed Rate: 4 mm/hr (ref 0–20)
